# Patient Record
Sex: Male | Born: 1960 | Race: White | Hispanic: No | Smoking: Former smoker
Health system: Southern US, Community
[De-identification: ages and names within clinical notes are randomized; demographics above are authoritative.]

## PROBLEM LIST (undated history)

## (undated) DIAGNOSIS — E782 Mixed hyperlipidemia: Secondary | ICD-10-CM

## (undated) DIAGNOSIS — E7800 Pure hypercholesterolemia, unspecified: Secondary | ICD-10-CM

## (undated) DIAGNOSIS — E119 Type 2 diabetes mellitus without complications: Secondary | ICD-10-CM

## (undated) DIAGNOSIS — I82409 Acute embolism and thrombosis of unspecified deep veins of unspecified lower extremity: Secondary | ICD-10-CM

## (undated) DIAGNOSIS — E1129 Type 2 diabetes mellitus with other diabetic kidney complication: Secondary | ICD-10-CM

## (undated) DIAGNOSIS — R0600 Dyspnea, unspecified: Secondary | ICD-10-CM

## (undated) DIAGNOSIS — M79652 Pain in left thigh: Secondary | ICD-10-CM

## (undated) DIAGNOSIS — Z86718 Personal history of other venous thrombosis and embolism: Secondary | ICD-10-CM

## (undated) DIAGNOSIS — I25118 Atherosclerotic heart disease of native coronary artery with other forms of angina pectoris: Secondary | ICD-10-CM

## (undated) DIAGNOSIS — I2510 Atherosclerotic heart disease of native coronary artery without angina pectoris: Secondary | ICD-10-CM

## (undated) DIAGNOSIS — Z86711 Personal history of pulmonary embolism: Secondary | ICD-10-CM

## (undated) DIAGNOSIS — N200 Calculus of kidney: Secondary | ICD-10-CM

## (undated) HISTORY — PX: SHOULDER ARTHROSCOPY: SHX128

## (undated) HISTORY — PX: HUMERUS FRACTURE SURGERY: SHX670

---
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
## 2006-06-19 HISTORY — PX: ORTHOPEDIC SURGERY: SHX850

## 2015-06-09 DIAGNOSIS — Z794 Long term (current) use of insulin: Secondary | ICD-10-CM

## 2015-06-09 DIAGNOSIS — R809 Proteinuria, unspecified: Secondary | ICD-10-CM

## 2015-06-09 DIAGNOSIS — I25118 Atherosclerotic heart disease of native coronary artery with other forms of angina pectoris: Secondary | ICD-10-CM

## 2015-06-09 DIAGNOSIS — E1129 Type 2 diabetes mellitus with other diabetic kidney complication: Secondary | ICD-10-CM

## 2015-06-09 DIAGNOSIS — E782 Mixed hyperlipidemia: Secondary | ICD-10-CM

## 2015-06-09 DIAGNOSIS — E1140 Type 2 diabetes mellitus with diabetic neuropathy, unspecified: Secondary | ICD-10-CM | POA: Insufficient documentation

## 2015-06-09 HISTORY — DX: Mixed hyperlipidemia: E78.2

## 2015-06-09 HISTORY — DX: Atherosclerotic heart disease of native coronary artery with other forms of angina pectoris: I25.118

## 2015-06-09 HISTORY — DX: Proteinuria, unspecified: R80.9

## 2015-06-09 HISTORY — DX: Type 2 diabetes mellitus with other diabetic kidney complication: Z79.4

## 2015-06-09 HISTORY — DX: Type 2 diabetes mellitus with diabetic neuropathy, unspecified: E11.40

## 2015-06-09 HISTORY — DX: Type 2 diabetes mellitus with other diabetic kidney complication: E11.29

## 2016-02-04 ENCOUNTER — Ambulatory Visit: Payer: Self-pay

## 2016-02-04 ENCOUNTER — Other Ambulatory Visit: Payer: Self-pay | Admitting: Occupational Medicine

## 2016-02-04 DIAGNOSIS — M25472 Effusion, left ankle: Secondary | ICD-10-CM

## 2016-02-04 DIAGNOSIS — M79672 Pain in left foot: Secondary | ICD-10-CM

## 2016-02-04 DIAGNOSIS — M25572 Pain in left ankle and joints of left foot: Principal | ICD-10-CM

## 2016-02-18 ENCOUNTER — Ambulatory Visit (INDEPENDENT_AMBULATORY_CARE_PROVIDER_SITE_OTHER): Payer: Worker's Compensation | Admitting: Orthopaedic Surgery

## 2016-02-18 ENCOUNTER — Encounter (INDEPENDENT_AMBULATORY_CARE_PROVIDER_SITE_OTHER): Payer: Self-pay

## 2016-02-18 DIAGNOSIS — M79672 Pain in left foot: Secondary | ICD-10-CM | POA: Diagnosis not present

## 2016-02-19 ENCOUNTER — Encounter (INDEPENDENT_AMBULATORY_CARE_PROVIDER_SITE_OTHER): Payer: Self-pay | Admitting: Orthopaedic Surgery

## 2016-02-19 NOTE — Progress Notes (Signed)
   Office Visit Note   Patient: Roberto Sosa           Date of Birth: 1960-12-20           MRN: [id number] Visit Date: 02/18/2016              Requested by: No referring provider defined for this encounter. PCP: Dina RichUGH,ROBERT, MD   Assessment & Plan: Visit Diagnoses:  1. Left foot pain     Plan: Based on my assessment of think what he is got is either a plantar fascial rupture or posterior tibial tendon rupture. Recommend MRI of the left foot to fully evaluate this. Follow up after the MRI. Cam Walker for now.  Follow-Up Instructions: Return in about 2 weeks (around 03/03/2016) for review left foot MRI.   Orders:  Orders Placed This Encounter  Procedures  . MR Foot Left w/o contrast   No orders of the defined types were placed in this encounter.     Procedures: No procedures performed   Clinical Data: No additional findings.   Subjective: No chief complaint on file.   The patient is a 55 year old gentleman who had an injury to his left ankle and foot on 02/02/2016 in which he lost his balance and twisted his ankle and subsequently felt and heard a pop on the medial aspect of his ankle. He's been wearing a fracture boot. He is currently doing light duty. There is tingling burning and numbness. Pain does not radiate. Pain is worse with ambulation.    Review of Systems  Constitutional: Negative.   HENT: Negative.   Eyes: Negative.   Respiratory: Negative.   Cardiovascular: Negative.   Gastrointestinal: Negative.   Endocrine: Negative.   Genitourinary: Negative.   Musculoskeletal: Negative.   Skin: Negative.   Allergic/Immunologic: Negative.   Neurological: Negative.   Hematological: Negative.   Psychiatric/Behavioral: Negative.      Objective: Vital Signs: There were no vitals taken for this visit.  Physical Exam  Constitutional: He is oriented to person, place, and time. He appears well-developed and well-nourished.  HENT:  Head: Normocephalic and  atraumatic.  Eyes: EOM are normal.  Neck: Neck supple.  Cardiovascular: Intact distal pulses.   Pulmonary/Chest: Effort normal.  Abdominal: Soft.  Neurological: He is alert and oriented to person, place, and time.  Skin: Skin is warm.  Psychiatric: He has a normal mood and affect. His behavior is normal. Judgment and thought content normal.  Nursing note and vitals reviewed.   Ortho Exam Exam of the left ankle shows moderate swelling. He does have tenderness to palpation at the medial aspect of the navicular. Achilles is nontender. Achilles is intact and has good plantarflexion strength. He has decreased strength with heel raise. He has no focal motor or sensory deficits. He has good ankle dorsiflexion strength. Specialty Comments:  No specialty comments available.  Imaging: No results found.   PMFS History: There are no active problems to display for this patient.  History reviewed. No pertinent past medical history.  History reviewed. No pertinent family history.  History reviewed. No pertinent surgical history. Social History   Occupational History  . Not on file.   Social History Main Topics  . Smoking status: Not on file  . Smokeless tobacco: Not on file  . Alcohol use Not on file  . Drug use: Unknown  . Sexual activity: Not on file

## 2016-02-21 ENCOUNTER — Telehealth (INDEPENDENT_AMBULATORY_CARE_PROVIDER_SITE_OTHER): Payer: Self-pay

## 2016-02-21 NOTE — Telephone Encounter (Signed)
Not really a question.. Dr. Liliane ChannelZu, Friday you had me to stand on the ball of my left foot. Before I got to my car I had a burning sensation in the inner part of my left thigh and behind the leg just above the knee.. I came straight home and got off my feet.. This morning it still hurts but more like a cramp.. Just thought I'd let you know.. And it's harder moving my left foot.. [email or web address]

## 2016-02-21 NOTE — Telephone Encounter (Signed)
Ok thanks.  Make sure he gets his MRI

## 2016-02-25 ENCOUNTER — Telehealth (INDEPENDENT_AMBULATORY_CARE_PROVIDER_SITE_OTHER): Payer: Self-pay

## 2016-02-25 ENCOUNTER — Encounter (INDEPENDENT_AMBULATORY_CARE_PROVIDER_SITE_OTHER): Payer: Self-pay | Admitting: Orthopaedic Surgery

## 2016-02-25 NOTE — Telephone Encounter (Signed)
Need MRI report

## 2016-02-25 NOTE — Telephone Encounter (Signed)
Patient emailed me this.... Please advise.....   Roberto Sosa or Dr. Liliane ChannelZu I brought the disc this afternoon round 2:30 pm.. If it is looked at and you have any info will you please send me a text.Marland Kitchen. Also my thigh is still swole and sore from Dec. 1st when ask to shift my weight to the ball of my foot.. Thanks in advance..Marland Kitchen

## 2016-02-28 ENCOUNTER — Encounter (INDEPENDENT_AMBULATORY_CARE_PROVIDER_SITE_OTHER): Payer: Self-pay | Admitting: Orthopaedic Surgery

## 2016-02-28 ENCOUNTER — Ambulatory Visit (INDEPENDENT_AMBULATORY_CARE_PROVIDER_SITE_OTHER): Payer: Worker's Compensation | Admitting: Orthopaedic Surgery

## 2016-02-28 ENCOUNTER — Telehealth (INDEPENDENT_AMBULATORY_CARE_PROVIDER_SITE_OTHER): Payer: Self-pay | Admitting: Orthopaedic Surgery

## 2016-02-28 DIAGNOSIS — M79672 Pain in left foot: Secondary | ICD-10-CM | POA: Diagnosis not present

## 2016-02-28 NOTE — Progress Notes (Signed)
   Office Visit Note   Patient: Roberto Sosa           Date of Birth: 1960-10-31           MRN: [id number] Visit Date: 02/28/2016              Requested by: Olive Bassobert L Dough, MD [address] [place], [place] [address] PCP: Dina RichUGH,ROBERT, MD   Assessment & Plan: Visit Diagnoses:  1. Left foot pain     Plan: MRI neg for tendon tear.  Mainly contusions and edema.  Expect full recovery in 6-8 weeks.  Return to full duty.  F/u prn  Follow-Up Instructions: Return if symptoms worsen or fail to improve.   Orders:  No orders of the defined types were placed in this encounter.  No orders of the defined types were placed in this encounter.     Procedures: No procedures performed   Clinical Data: No additional findings.   Subjective: Chief Complaint  Patient presents with  . Left Foot - Pain, Follow-up    Following up today for MRI.  Doing better.  Would like to return to full duty.     Review of Systems   Objective: Vital Signs: There were no vitals taken for this visit.  Physical Exam  Ortho Exam Foot is less tender and swelling is improved Specialty Comments:  No specialty comments available.  Imaging: No results found.   PMFS History: There are no active problems to display for this patient.  No past medical history on file.  No family history on file.  No past surgical history on file. Social History   Occupational History  . Not on file.   Social History Main Topics  . Smoking status: Never Smoker  . Smokeless tobacco: Never Used  . Alcohol use Not on file  . Drug use: Unknown  . Sexual activity: Not on file

## 2016-02-28 NOTE — Telephone Encounter (Signed)
Pt work is requesting a return to work note for pt. Her name is Roberto ForwardJo Matthews at [phone], she asked if you can fax this note to [phone]. Attention to: claim number [id number]

## 2016-02-29 NOTE — Telephone Encounter (Signed)
FAXED NOTE

## 2016-03-01 ENCOUNTER — Encounter (INDEPENDENT_AMBULATORY_CARE_PROVIDER_SITE_OTHER): Payer: Self-pay | Admitting: Orthopaedic Surgery

## 2016-03-02 ENCOUNTER — Encounter (INDEPENDENT_AMBULATORY_CARE_PROVIDER_SITE_OTHER): Payer: Self-pay | Admitting: Orthopaedic Surgery

## 2016-03-03 ENCOUNTER — Encounter (INDEPENDENT_AMBULATORY_CARE_PROVIDER_SITE_OTHER): Payer: Self-pay | Admitting: Orthopaedic Surgery

## 2016-03-03 ENCOUNTER — Ambulatory Visit (INDEPENDENT_AMBULATORY_CARE_PROVIDER_SITE_OTHER): Payer: Worker's Compensation | Admitting: Orthopaedic Surgery

## 2016-03-03 DIAGNOSIS — M79652 Pain in left thigh: Secondary | ICD-10-CM | POA: Diagnosis not present

## 2016-03-03 HISTORY — DX: Pain in left thigh: M79.652

## 2016-03-03 MED ORDER — METHOCARBAMOL 500 MG PO TABS: 500.0000 mg | ORAL_TABLET | Freq: Four times a day (QID) | ORAL | 2 refills | Status: DC | PRN

## 2016-03-03 MED ORDER — NAPROXEN 500 MG PO TABS: 500.0000 mg | ORAL_TABLET | Freq: Two times a day (BID) | ORAL | 3 refills | Status: DC

## 2016-03-03 NOTE — Progress Notes (Signed)
   Office Visit Note   Patient: Roberto Sosa           Date of Birth: 1960/04/20           MRN: [id number] Visit Date: 03/03/2016              Requested by: Olive Bassobert L Dough, MD [address] [place], [place] [address] PCP: Dina RichUGH,ROBERT, MD   Assessment & Plan: Visit Diagnoses:  1. Left thigh pain     Plan: Impression is left quadriceps overuse from altered gait. I gave him prescription for muscle relaxer continue with ice and Ace wrap, rest out of work for 2 days. Follow up as needed.  Follow-Up Instructions: Return if symptoms worsen or fail to improve.   Orders:  No orders of the defined types were placed in this encounter.  Meds ordered this encounter  Medications  . methocarbamol (ROBAXIN) 500 MG tablet    Sig: Take 1 tablet (500 mg total) by mouth every 6 (six) hours as needed for muscle spasms.    Dispense:  30 tablet    Refill:  2  . naproxen (NAPROSYN) 500 MG tablet    Sig: Take 1 tablet (500 mg total) by mouth 2 (two) times daily with a meal.    Dispense:  30 tablet    Refill:  3      Procedures: No procedures performed   Clinical Data: No additional findings.   Subjective: Chief Complaint  Patient presents with  . Left Foot - Follow-up, Pain  . Left Thigh - Pain    Patient comes in today with a new plane of left thigh pain. He has been walking with an altered gait which is his thigh out of whack. He states that it burns and cramps. The foot is still slightly stiff. He denies any chest pain shortness of breath or calf pain. He has difficulty sleeping because of the quadriceps pain. He's been taking Tylenol and Aleve.    Review of Systems  Constitutional: Negative.   HENT: Negative.   Eyes: Negative.   Respiratory: Negative.   Cardiovascular: Negative.   Gastrointestinal: Negative.   Endocrine: Negative.   Genitourinary: Negative.   Musculoskeletal: Negative.   Skin: Negative.   Allergic/Immunologic: Negative.   Neurological: Negative.     Hematological: Negative.   Psychiatric/Behavioral: Negative.      Objective: Vital Signs: There were no vitals taken for this visit.  Physical Exam  Well-developed well-nourished no acute distress alert and 3 Ortho Exam Exam of the left quad shows tenderness palpation in the mid forearm region. His knee and hip are both asymptomatic benign exam. Specialty Comments:  No specialty comments available.  Imaging: No results found.   PMFS History: Patient Active Problem List   Diagnosis Date Noted  . Left thigh pain 03/03/2016   No past medical history on file.  No family history on file.  No past surgical history on file. Social History   Occupational History  . Not on file.   Social History Main Topics  . Smoking status: Never Smoker  . Smokeless tobacco: Never Used  . Alcohol use Not on file  . Drug use: Unknown  . Sexual activity: Not on file

## 2016-03-06 ENCOUNTER — Encounter (INDEPENDENT_AMBULATORY_CARE_PROVIDER_SITE_OTHER): Payer: Self-pay | Admitting: Orthopaedic Surgery

## 2016-03-06 ENCOUNTER — Emergency Department (HOSPITAL_COMMUNITY)
Admission: EM | Admit: 2016-03-06 | Discharge: 2016-03-06 | Disposition: A | Discharge status: Home / Self Care | Payer: Worker's Compensation | Source: Home / Self Care | Attending: Emergency Medicine | Admitting: Emergency Medicine

## 2016-03-06 ENCOUNTER — Encounter (HOSPITAL_COMMUNITY): Payer: Self-pay

## 2016-03-06 DIAGNOSIS — M79605 Pain in left leg: Secondary | ICD-10-CM | POA: Diagnosis not present

## 2016-03-06 DIAGNOSIS — I82402 Acute embolism and thrombosis of unspecified deep veins of left lower extremity: Secondary | ICD-10-CM | POA: Insufficient documentation

## 2016-03-06 DIAGNOSIS — I82422 Acute embolism and thrombosis of left iliac vein: Secondary | ICD-10-CM | POA: Diagnosis not present

## 2016-03-06 DIAGNOSIS — Z79899 Other long term (current) drug therapy: Secondary | ICD-10-CM

## 2016-03-06 DIAGNOSIS — Z7982 Long term (current) use of aspirin: Secondary | ICD-10-CM | POA: Insufficient documentation

## 2016-03-06 DIAGNOSIS — Z7984 Long term (current) use of oral hypoglycemic drugs: Secondary | ICD-10-CM

## 2016-03-06 DIAGNOSIS — I2510 Atherosclerotic heart disease of native coronary artery without angina pectoris: Secondary | ICD-10-CM | POA: Insufficient documentation

## 2016-03-06 DIAGNOSIS — Z7901 Long term (current) use of anticoagulants: Secondary | ICD-10-CM | POA: Insufficient documentation

## 2016-03-06 DIAGNOSIS — E119 Type 2 diabetes mellitus without complications: Secondary | ICD-10-CM | POA: Insufficient documentation

## 2016-03-06 HISTORY — DX: Pure hypercholesterolemia, unspecified: E78.00

## 2016-03-06 HISTORY — DX: Type 2 diabetes mellitus without complications: E11.9

## 2016-03-06 HISTORY — DX: Atherosclerotic heart disease of native coronary artery without angina pectoris: I25.10

## 2016-03-06 LAB — I-STAT CG4 LACTIC ACID, ED
Lactic Acid, Venous: 2.8 mmol/L (ref 0.5–1.9)
Lactic Acid, Venous: 2.91 mmol/L (ref 0.5–1.9)

## 2016-03-06 LAB — BASIC METABOLIC PANEL
Anion gap: 12 (ref 5–15)
CHLORIDE: 103 mmol/L (ref 101–111)
CO2: 22 mmol/L (ref 22–32)
Calcium: 9.3 mg/dL (ref 8.9–10.3)
Creatinine, Ser: 0.62 mg/dL (ref 0.61–1.24)
GFR calc Af Amer: >60 mL/min (ref >60)
GFR calc non Af Amer: >60 mL/min (ref >60)
GLUCOSE: 318 mg/dL — AB (ref 65–99)
POTASSIUM: 4 mmol/L (ref 3.5–5.1)
Sodium: 137 mmol/L (ref 135–145)

## 2016-03-06 LAB — CBC WITH DIFFERENTIAL/PLATELET
Basophils Absolute: 0 10*3/uL (ref 0.0–0.1)
Basophils Relative: 0 %
EOS PCT: 3 %
Eosinophils Absolute: 0.3 10*3/uL (ref 0.0–0.7)
HCT: 42.9 % (ref 39.0–52.0)
HEMOGLOBIN: 15.1 g/dL (ref 13.0–17.0)
LYMPHS ABS: 2.7 10*3/uL (ref 0.7–4.0)
LYMPHS PCT: 25 %
MCH: 29 pg (ref 26.0–34.0)
MCHC: 35.2 g/dL (ref 30.0–36.0)
MCV: 82.3 fL (ref 78.0–100.0)
Monocytes Absolute: 0.5 10*3/uL (ref 0.1–1.0)
Monocytes Relative: 5 %
Neutro Abs: 7.2 10*3/uL (ref 1.7–7.7)
Neutrophils Relative %: 67 %
PLATELETS: 201 10*3/uL (ref 150–400)
RBC: 5.21 MIL/uL (ref 4.22–5.81)
RDW: 13 % (ref 11.5–15.5)
WBC: 10.8 10*3/uL — AB (ref 4.0–10.5)

## 2016-03-06 MED ORDER — RIVAROXABAN (XARELTO) EDUCATION KIT FOR DVT/PE PATIENTS
PACK | Freq: Once | Status: AC
  Administered 2016-03-06: 23:00:00
  Filled 2016-03-06: qty 1

## 2016-03-06 MED ORDER — RIVAROXABAN 15 MG PO TABS
15.0000 mg | ORAL_TABLET | Freq: Once | ORAL | Status: AC
  Administered 2016-03-06: 15 mg via ORAL
  Filled 2016-03-06: qty 1

## 2016-03-06 MED ORDER — RIVAROXABAN (XARELTO) VTE STARTER PACK (15 & 20 MG): ORAL_TABLET | ORAL | 0 refills | Status: DC

## 2016-03-06 NOTE — Telephone Encounter (Signed)
Patient emailed us today. See message below.  The swelling in the left calf and thigh hasn't gone away. I've been off my leg totally since Friday except to use the bathroom..Marland Kitchen

## 2016-03-06 NOTE — Discharge Instructions (Signed)
Your blood work has been normal, your ultrasound shows that you have a blood clot in your left leg. Please come back in the morning for a formal ultrasound, go to outpatient imaging and the radiology department at this hospital. If you should develop any chest pain or difficulty breathing or if your leg begins to swell even more or becomes more painful, please return to the emergency department immediately for repeat evaluation as this could be a life-threatening complication of a blood clot.  Please call your doctor in the morning to inform them of this new diagnosis of a blood clot. He will need to be seen within the next week for follow-up and for continual management of this problem. He will likely continue to have swelling of her leg for the next couple of months but it should gradually improve. Again if things get any worse please come back immediately.  Additionally, if you should develop any abnormal bleeding including nosebleeds, blood in your stool, blood in your urine, vomiting up blood or severe or worsening headache or difficulty breathing, come back to the emergency department immediately.

## 2016-03-06 NOTE — ED Provider Notes (Signed)
[facility]-EMERGENCY DEPT Provider Note   CSN: [id number] Arrival date & time: 03/06/16  1722     History   Chief Complaint Chief Complaint  Patient presents with  . left leg pain/swelling    HPI Roberto Sosa is a 55 y.o. male.  HPI  The patient is a 55-year-old male, known history of coronary disease, diabetes and high cholesterol, presents to the hospital today with a swollen left leg. He reports that approximately one month ago he fell while at work, he bent his leg the wrong way, he felt acute onset of pain and was was able to walk on the leg, he followed up with orthopedics in approximately 18 days ago on December 1 while he was being examined he felt more pain in the leg and since that time has had progressive swelling of the left lower extremity from the mid calf all the way up to the groin. This has been persistent, gradually worsening, it was recommended today by his orthopedist that he come to the hospital for an ultrasound of his leg. The patient denies any chest pain or shortness of breath whatsoever. He has never had a blood clot, no other predisposing risk factors for blood clots.  Past Medical History:  Diagnosis Date  . Coronary artery disease   . Diabetes mellitus without complication (HCC)   . High cholesterol     Patient Active Problem List   Diagnosis Date Noted  . Left thigh pain 03/03/2016    History reviewed. No pertinent surgical history.     Home Medications    Prior to Admission medications   Medication Sig Start Date End Date Taking? Authorizing Provider  aspirin EC 81 MG tablet Take by mouth.    Historical Provider, MD  atorvastatin (LIPITOR) 80 MG tablet Take 80 mg by mouth. 03/02/16   Historical Provider, MD  carvedilol (COREG) 12.5 MG tablet Take 12.5 mg by mouth. 03/01/16   Historical Provider, MD  gabapentin (NEURONTIN) 600 MG tablet Take 600 mg by mouth. 03/01/16   Historical Provider, MD  metFORMIN (GLUCOPHAGE) 1000 MG tablet Take 1,000 mg  by mouth. 03/02/16   Historical Provider, MD  methocarbamol (ROBAXIN) 500 MG tablet Take 1 tablet (500 mg total) by mouth every 6 (six) hours as needed for muscle spasms. 03/03/16   Naiping M Xu, MD  naproxen (NAPROSYN) 500 MG tablet Take 1 tablet (500 mg total) by mouth 2 (two) times daily with a meal. 03/03/16   Naiping M Xu, MD  nitroGLYCERIN (NITROSTAT) 0.4 MG SL tablet Place 0.4 mg under the tongue.    Historical Provider, MD  Rivaroxaban 15 & 20 MG TBPK Take as directed on package: Start with one 15mg tablet by mouth twice a day with food. On Day 22, switch to one 20mg tablet once a day with food. 03/06/16   Brian Miller, MD    Family History No family history on file.  Social History Social History  Substance Use Topics  . Smoking status: Never Smoker  . Smokeless tobacco: Never Used  . Alcohol use Not on file     Allergies   Patient has no known allergies.   Review of Systems Review of Systems  All other systems reviewed and are negative.    Physical Exam Updated Vital Signs BP 143/73 (BP Location: Left Arm)   Pulse 80   Temp 97.8 F (36.6 C) (Oral)   Resp (!) 27   SpO2 98%   Physical Exam  Constitutional: He appears well-developed and   well-nourished. No distress.  HENT:  Head: Normocephalic and atraumatic.  Mouth/Throat: Oropharynx is clear and moist. No oropharyngeal exudate.  Eyes: Conjunctivae and EOM are normal. Pupils are equal, round, and reactive to light. Right eye exhibits no discharge. Left eye exhibits no discharge. No scleral icterus.  Neck: Normal range of motion. Neck supple. No JVD present. No thyromegaly present.  Cardiovascular: Normal rate, regular rhythm, normal heart sounds and intact distal pulses.  Exam reveals no gallop and no friction rub.   No murmur heard. Pulmonary/Chest: Effort normal and breath sounds normal. No respiratory distress. He has no wheezes. He has no rales.  Abdominal: Soft. Bowel sounds are normal. He exhibits no  distension and no mass. There is no tenderness.  Musculoskeletal: Normal range of motion. He exhibits edema. He exhibits no tenderness.  The patient has significant edema of the left lower extremity, there are normal pulses to the left foot, normal capillary refill but he does have 2+ pitting edema from the knee through the ankle and a slight fullness of the thigh. The right lower extremity is normal, bilateral upper extremities are normal.  Lymphadenopathy:    He has no cervical adenopathy.  Neurological: He is alert. Coordination normal.  Skin: Skin is warm and dry. No rash noted. No erythema.  Psychiatric: He has a normal mood and affect. His behavior is normal.  Nursing note and vitals reviewed.    ED Treatments / Results  Labs (all labs ordered are listed, but only abnormal results are displayed) Labs Reviewed  CBC WITH DIFFERENTIAL/PLATELET - Abnormal; Notable for the following:       Result Value   WBC 10.8 (*)    All other components within normal limits  BASIC METABOLIC PANEL - Abnormal; Notable for the following:    Glucose, Bld 318 (*)    BUN <5 (*)    All other components within normal limits  I-STAT CG4 LACTIC ACID, ED - Abnormal; Notable for the following:    Lactic Acid, Venous 2.80 (*)    All other components within normal limits  I-STAT CG4 LACTIC ACID, ED    Radiology No results found.  Procedures Procedures (including critical care time)  Medications Ordered in ED Medications  rivaroxaban (XARELTO) Education Kit for DVT/PE patients (not administered)  Rivaroxaban (XARELTO) tablet 15 mg (15 mg Oral Given 03/06/16 2218)     Initial Impression / Assessment and Plan / ED Course  I have reviewed the triage vital signs and the nursing notes.  Pertinent labs & imaging results that were available during my care of the patient were reviewed by me and considered in my medical decision making (see chart for details).  Clinical Course    The patient has clear  evidence of a DVT on my bedside ultrasound, this fits with his clinical presentation, he does not have any signs of pulmonary embolism including no tachycardia, hypotension, hypoxia or any chest pain or shortness of breath. His hemoglobin is normal, his kidney function is normal, she will qualify for Xarelto treatment and after a long discussion with the patient about the risks benefits and alternatives of treatment with anticoagulants he has agreed to Xarelto treatment going forward instead of Coumadin. He will call his doctor in the morning to let them know about the findings as well as follow up for the formal ultrasound which I will order. I have given him all of the indications for return including chest pain shortness of breath or any significant abnormal feelings in his   chest or leg worsening swelling. He has accepted these terms and is in agreement with discharge. Xarelto given prior to discharge.  Final Clinical Impressions(s) / ED Diagnoses   Final diagnoses:  Acute deep vein thrombosis (DVT) of left lower extremity, unspecified vein (HCC)    New Prescriptions New Prescriptions   RIVAROXABAN 15 & 20 MG TBPK    Take as directed on package: Start with one 32m tablet by mouth twice a day with food. On Day 22, switch to one 258mtablet once a day with food.     BrNoemi ChapelMD 03/06/16 2221

## 2016-03-06 NOTE — ED Triage Notes (Addendum)
Patient here with left leg swelling from hip to foot since 12/1. States he experienced fall in November and thinks related. Positive distal pulses, no redness from ankle to foot. denies pain but complains of tightness. No shortness of breath, no CP

## 2016-03-06 NOTE — ED Notes (Signed)
ED Provider at bedside. 

## 2016-03-06 NOTE — ED Notes (Signed)
Pt departed in NAD.  

## 2016-03-07 ENCOUNTER — Inpatient Hospital Stay (HOSPITAL_COMMUNITY)
Admission: AD | Admit: 2016-03-07 | Discharge: 2016-03-11 | DRG: 272 | Disposition: A | Discharge status: Home / Self Care | Payer: Worker's Compensation | Source: Ambulatory Visit | Attending: Vascular Surgery | Admitting: Vascular Surgery

## 2016-03-07 ENCOUNTER — Encounter (HOSPITAL_COMMUNITY): Payer: Self-pay

## 2016-03-07 DIAGNOSIS — I82422 Acute embolism and thrombosis of left iliac vein: Principal | ICD-10-CM | POA: Diagnosis present

## 2016-03-07 DIAGNOSIS — I824Y2 Acute embolism and thrombosis of unspecified deep veins of left proximal lower extremity: Secondary | ICD-10-CM | POA: Diagnosis not present

## 2016-03-07 DIAGNOSIS — Z7901 Long term (current) use of anticoagulants: Secondary | ICD-10-CM | POA: Diagnosis not present

## 2016-03-07 DIAGNOSIS — Z7984 Long term (current) use of oral hypoglycemic drugs: Secondary | ICD-10-CM

## 2016-03-07 DIAGNOSIS — M7989 Other specified soft tissue disorders: Secondary | ICD-10-CM | POA: Diagnosis not present

## 2016-03-07 DIAGNOSIS — E1151 Type 2 diabetes mellitus with diabetic peripheral angiopathy without gangrene: Secondary | ICD-10-CM | POA: Diagnosis present

## 2016-03-07 DIAGNOSIS — E7800 Pure hypercholesterolemia, unspecified: Secondary | ICD-10-CM | POA: Diagnosis present

## 2016-03-07 DIAGNOSIS — I82409 Acute embolism and thrombosis of unspecified deep veins of unspecified lower extremity: Secondary | ICD-10-CM | POA: Diagnosis present

## 2016-03-07 DIAGNOSIS — M79605 Pain in left leg: Secondary | ICD-10-CM | POA: Diagnosis present

## 2016-03-07 DIAGNOSIS — Z86718 Personal history of other venous thrombosis and embolism: Secondary | ICD-10-CM

## 2016-03-07 DIAGNOSIS — Z7982 Long term (current) use of aspirin: Secondary | ICD-10-CM

## 2016-03-07 DIAGNOSIS — I82412 Acute embolism and thrombosis of left femoral vein: Secondary | ICD-10-CM | POA: Diagnosis present

## 2016-03-07 DIAGNOSIS — Z79899 Other long term (current) drug therapy: Secondary | ICD-10-CM

## 2016-03-07 DIAGNOSIS — M79609 Pain in unspecified limb: Secondary | ICD-10-CM

## 2016-03-07 DIAGNOSIS — I82402 Acute embolism and thrombosis of unspecified deep veins of left lower extremity: Secondary | ICD-10-CM

## 2016-03-07 DIAGNOSIS — I2510 Atherosclerotic heart disease of native coronary artery without angina pectoris: Secondary | ICD-10-CM | POA: Diagnosis present

## 2016-03-07 HISTORY — DX: Acute embolism and thrombosis of unspecified deep veins of unspecified lower extremity: I82.409

## 2016-03-07 HISTORY — DX: Personal history of other venous thrombosis and embolism: Z86.718

## 2016-03-07 LAB — GLUCOSE, CAPILLARY
Glucose-Capillary: 248 mg/dL — ABNORMAL HIGH (ref 65–99)
Glucose-Capillary: 259 mg/dL — ABNORMAL HIGH (ref 65–99)

## 2016-03-07 LAB — SURGICAL PCR SCREEN
MRSA, PCR: NEGATIVE
Staphylococcus aureus: POSITIVE — AB

## 2016-03-07 MED ORDER — PANTOPRAZOLE SODIUM 40 MG PO TBEC: 40.0000 mg | DELAYED_RELEASE_TABLET | Freq: Every day | ORAL | Status: DC

## 2016-03-07 MED ORDER — GUAIFENESIN-DM 100-10 MG/5ML PO SYRP
15.0000 mL | ORAL_SOLUTION | ORAL | Status: DC | PRN
Start: 2016-03-07 — End: 2016-03-07

## 2016-03-07 MED ORDER — POTASSIUM CHLORIDE CRYS ER 20 MEQ PO TBCR: 20.0000 meq | EXTENDED_RELEASE_TABLET | Freq: Once | ORAL | Status: DC

## 2016-03-07 MED ORDER — HYDRALAZINE HCL 20 MG/ML IJ SOLN: 5.0000 mg | INTRAMUSCULAR | Status: DC | PRN

## 2016-03-07 MED ORDER — SODIUM CHLORIDE 0.9 % IV SOLN: INTRAVENOUS | Status: DC

## 2016-03-07 MED ORDER — ACETAMINOPHEN 325 MG RE SUPP: 325.0000 mg | RECTAL | Status: DC | PRN

## 2016-03-07 MED ORDER — METHOCARBAMOL 500 MG PO TABS
500.0000 mg | ORAL_TABLET | Freq: Four times a day (QID) | ORAL | Status: DC | PRN
  Administered 2016-03-08 – 2016-03-09 (×2): 500 mg via ORAL
  Filled 2016-03-07 (×2): qty 1

## 2016-03-07 MED ORDER — ACETAMINOPHEN 325 MG PO TABS: 325.0000 mg | ORAL_TABLET | ORAL | Status: DC | PRN

## 2016-03-07 MED ORDER — PHENOL 1.4 % MT LIQD: 1.0000 | OROMUCOSAL | Status: DC | PRN

## 2016-03-07 MED ORDER — METOPROLOL TARTRATE 5 MG/5ML IV SOLN: 2.0000 mg | INTRAVENOUS | Status: DC | PRN

## 2016-03-07 MED ORDER — METFORMIN HCL 500 MG PO TABS
1000.0000 mg | ORAL_TABLET | Freq: Every day | ORAL | Status: DC
  Administered 2016-03-08: 1000 mg via ORAL
  Filled 2016-03-07 (×2): qty 2

## 2016-03-07 MED ORDER — ONDANSETRON HCL 4 MG/2ML IJ SOLN: 4.0000 mg | Freq: Four times a day (QID) | INTRAMUSCULAR | Status: DC | PRN

## 2016-03-07 MED ORDER — LABETALOL HCL 5 MG/ML IV SOLN: 10.0000 mg | INTRAVENOUS | Status: DC | PRN

## 2016-03-07 MED ORDER — OXYCODONE-ACETAMINOPHEN 5-325 MG PO TABS: 1.0000 | ORAL_TABLET | ORAL | Status: DC | PRN

## 2016-03-07 MED ORDER — HEPARIN BOLUS VIA INFUSION
6000.0000 [IU] | Freq: Once | INTRAVENOUS | Status: AC
  Administered 2016-03-07: 6000 [IU] via INTRAVENOUS
  Filled 2016-03-07: qty 6000

## 2016-03-07 MED ORDER — CARVEDILOL 12.5 MG PO TABS
12.5000 mg | ORAL_TABLET | Freq: Every day | ORAL | Status: DC
  Administered 2016-03-07 – 2016-03-11 (×5): 12.5 mg via ORAL
  Filled 2016-03-07 (×5): qty 1

## 2016-03-07 MED ORDER — SENNOSIDES-DOCUSATE SODIUM 8.6-50 MG PO TABS: 1.0000 | ORAL_TABLET | Freq: Every evening | ORAL | Status: DC | PRN

## 2016-03-07 MED ORDER — INSULIN ASPART 100 UNIT/ML ~~LOC~~ SOLN
0.0000 [IU] | Freq: Three times a day (TID) | SUBCUTANEOUS | Status: DC
  Administered 2016-03-07: 3 [IU] via SUBCUTANEOUS

## 2016-03-07 MED ORDER — GABAPENTIN 600 MG PO TABS
600.0000 mg | ORAL_TABLET | Freq: Every day | ORAL | Status: DC
  Administered 2016-03-07 – 2016-03-10 (×4): 600 mg via ORAL
  Filled 2016-03-07 (×4): qty 1

## 2016-03-07 MED ORDER — HEPARIN (PORCINE) IN NACL 100-0.45 UNIT/ML-% IJ SOLN
1800.0000 [IU]/h | INTRAMUSCULAR | Status: DC
  Administered 2016-03-07 – 2016-03-08 (×3): 1800 [IU]/h via INTRAVENOUS
  Filled 2016-03-07 (×4): qty 250

## 2016-03-07 MED ORDER — ALUM & MAG HYDROXIDE-SIMETH 200-200-20 MG/5ML PO SUSP: 15.0000 mL | ORAL | Status: DC | PRN

## 2016-03-07 MED ORDER — DOCUSATE SODIUM 100 MG PO CAPS: 100.0000 mg | ORAL_CAPSULE | Freq: Two times a day (BID) | ORAL | Status: DC

## 2016-03-07 MED ORDER — ASPIRIN EC 81 MG PO TBEC
81.0000 mg | DELAYED_RELEASE_TABLET | Freq: Every day | ORAL | Status: DC
  Administered 2016-03-07 – 2016-03-10 (×4): 81 mg via ORAL
  Filled 2016-03-07 (×4): qty 1

## 2016-03-07 NOTE — H&P (Signed)
Referring Physician: Eber HongBrian Miller MD  Patient name: Roberto Sosa MRN: [id number] DOB: 10-13-1960 Sex: male  REASON FOR CONSULT: left leg DVT  HPI: Roberto Sosa is a 55 y.o. male with history of fall at work on left leg 11/17.  He saw Dr Fayrene FearingXiu from orthopedics on 12/1 ankle and foot xray essentially negative.  He had an MRI of foot as well but this is not available today.  He developed swelling in left leg after that appt.  He was seen in the ER last pm and got a xarelto starter pack last night.  At his US today in vasc lab he had extensive DVT entire left leg.  Leg is painful and heavy but motor sensation intact.  He denies hemoptysis or dyspnea.  Chronic medical problems include CAD, DM, elevated cholesterol all of which are stable.  He denies family history of DVT but is adopted.  He has no prior history of head bleed or there bleeding complications.  Past Medical History:  Diagnosis Date  . Coronary artery disease   . Diabetes mellitus without complication (HCC)   . High cholesterol    No past surgical history on file.  No family history on file.  SOCIAL HISTORY: Social History   Social History  . Marital status: Divorced    Spouse name: N/A  . Number of children: N/A  . Years of education: N/A   Occupational History  . Not on file.   Social History Main Topics  . Smoking status: Never Smoker  . Smokeless tobacco: Never Used  . Alcohol use Not on file  . Drug use: Unknown  . Sexual activity: Not on file   Other Topics Concern  . Not on file   Social History Narrative  . No narrative on file    No Known Allergies  Current Outpatient Prescriptions  Medication Sig Dispense Refill  . aspirin EC 81 MG tablet Take by mouth.    Marland Kitchen. atorvastatin (LIPITOR) 80 MG tablet Take 80 mg by mouth.    . carvedilol (COREG) 12.5 MG tablet Take 12.5 mg by mouth.    . gabapentin (NEURONTIN) 600 MG tablet Take 600 mg by mouth.    . metFORMIN (GLUCOPHAGE) 1000 MG tablet Take 1,000 mg by  mouth.    . methocarbamol (ROBAXIN) 500 MG tablet Take 1 tablet (500 mg total) by mouth every 6 (six) hours as needed for muscle spasms. 30 tablet 2  . naproxen (NAPROSYN) 500 MG tablet Take 1 tablet (500 mg total) by mouth 2 (two) times daily with a meal. 30 tablet 3  . nitroGLYCERIN (NITROSTAT) 0.4 MG SL tablet Place 0.4 mg under the tongue.    . Rivaroxaban 15 & 20 MG TBPK Take as directed on package: Start with one 15mg  tablet by mouth twice a day with food. On Day 22, switch to one 20mg  tablet once a day with food. 51 each 0   No current facility-administered medications for this encounter.     ROS:   General:  No weight loss, Fever, chills  HEENT: No recent headaches, no nasal bleeding, no visual changes, no sore throat  Neurologic: No dizziness, blackouts, seizures. No recent symptoms of stroke or mini- stroke. No recent episodes of slurred speech, or temporary blindness.  Cardiac: No recent episodes of chest pain/pressure, no shortness of breath at rest.  No shortness of breath with exertion.  Denies history of atrial fibrillation or irregular heartbeat  Vascular: No history of rest pain in feet.  No history of claudication.  No history of non-healing ulcer, No history of DVT   Pulmonary: No home oxygen, no productive cough, no hemoptysis,  No asthma or wheezing  Musculoskeletal:  [ ]  Arthritis, [ ]  Low back pain,  [ ]  Joint pain  Hematologic:No history of hypercoagulable state.  No history of easy bleeding.  No history of anemia  Gastrointestinal: No hematochezia or melena,  No gastroesophageal reflux, no trouble swallowing  Urinary: [ ]  chronic Kidney disease, [ ]  on HD - [ ]  MWF or [ ]  TTHS, [ ]  Burning with urination, [ ]  Frequent urination, [ ]  Difficulty urinating;   Skin: No rashes  Psychological: No history of anxiety,  No history of depression   Physical Examination  There were no vitals filed for this visit.  There is no height or weight on file to calculate  BMI.  General:  Alert and oriented, no acute distress HEENT: Normal Neck: No bruit or JVD Pulmonary: Clear to auscultation bilaterally Cardiac: Regular Rate and Rhythm  Abdomen: Soft, non-tender, non-distended, no mass, no scars Skin: No rash Extremity Pulses:  2+ radial, brachial, femoral, right 2+dorsalis pedis, posterior tibial pulses absent on left but probably secondary to edema foot is pink and warm Musculoskeletal: No deformity extensive 3+ pitting edema entire left leg extending from thigh to foot Neurologic: Upper and lower extremity motor 5/5 and symmetric  DATA:  Left leg DVT femoral down to distal leg  ASSESSMENT:  Extensive left leg DVT would benefit from thrombolysis to prevent post phlebitic syndrome   PLAN:  1. NPO p midnight  2. IV heparin  3. Consent for thrombolysis   Fabienne Brunsharles Fields, MD [facility] [place] Office: [phone] Pager: [phone]

## 2016-03-07 NOTE — Progress Notes (Signed)
VASCULAR LAB PRELIMINARY  PRELIMINARY  PRELIMINARY  PRELIMINARY  Bilateral lower extremity venous duplex completed.    Preliminary report:  Left - positive for DVT in the posterior tibial, popliteal, femoral and common femoral veins. Superficial thrombosis of the porximal greater saphenous vein No evidence of a Baker's cyst. Right - Right:  No evidence of DVT, superficial thrombosis, or Baker's cyst.  Rainn Zupko, RVS 03/07/2016, 5:35 PM

## 2016-03-07 NOTE — Progress Notes (Signed)
Pt has arrived to 2w as direct admit. Telemetry box applied and CCMD notified. Pt oriented to room. Vitals are stable. Assessment completed. Admission assessment completed. IV inserted. Pharmacy tech notified of pt's arrival. Pt has no complaints at this time. Will follow current plan of care.   Berdine DanceLauren Moffitt BSN, RN

## 2016-03-07 NOTE — Progress Notes (Signed)
ANTICOAGULATION CONSULT NOTE - Initial Consult  Pharmacy Consult for heparin Indication: DVT  No Known Allergies  Patient Measurements: TBW: 122.8 kg IBW: 80 kg Heparin Dosing Weight: 107 kg  Assessment: 55 yo M presents on 12/18 with new left leg DVT. Pharmacy consulted for heparin.  CBC stable, no s/s of bleed. No anticoag PTA. Plan is for vascular surgery to do thrombolysis tomorrow.  Goal of Therapy:  Heparin level 0.3-0.7 units/ml Monitor platelets by anticoagulation protocol: Yes   Plan:  Give heparin 6,000 unit bolus Start heparin gtt at 1,800 units/hr Check 6 hr heparin level Monitor daily heparin level, CBC, s/s of bleed  Enzo BiNathan Layani Foronda, PharmD, BCPS Clinical Pharmacist Pager [phone] 03/07/2016 5:03 PM

## 2016-03-08 ENCOUNTER — Ambulatory Visit (HOSPITAL_COMMUNITY): Admission: RE | Admit: 2016-03-08 | Payer: Self-pay | Source: Ambulatory Visit | Admitting: Vascular Surgery

## 2016-03-08 LAB — CBC
HCT: 38.4 % — ABNORMAL LOW (ref 39.0–52.0)
HEMOGLOBIN: 13.4 g/dL (ref 13.0–17.0)
MCH: 28.5 pg (ref 26.0–34.0)
MCHC: 34.9 g/dL (ref 30.0–36.0)
MCV: 81.7 fL (ref 78.0–100.0)
Platelets: 199 10*3/uL (ref 150–400)
RBC: 4.7 MIL/uL (ref 4.22–5.81)
RDW: 13 % (ref 11.5–15.5)
WBC: 10.2 10*3/uL (ref 4.0–10.5)

## 2016-03-08 LAB — GLUCOSE, CAPILLARY
GLUCOSE-CAPILLARY: 237 mg/dL — AB (ref 65–99)
Glucose-Capillary: 426 mg/dL — ABNORMAL HIGH (ref 65–99)
Glucose-Capillary: 240 mg/dL — ABNORMAL HIGH (ref 65–99)

## 2016-03-08 LAB — APTT: APTT: 71 s — AB (ref 24–36)

## 2016-03-08 LAB — HEPARIN LEVEL (UNFRACTIONATED)
HEPARIN UNFRACTIONATED: 0.54 [IU]/mL (ref 0.30–0.70)
Heparin Unfractionated: 0.57 IU/mL (ref 0.30–0.70)

## 2016-03-08 MED ORDER — INSULIN ASPART 100 UNIT/ML ~~LOC~~ SOLN
0.0000 [IU] | Freq: Three times a day (TID) | SUBCUTANEOUS | Status: DC
Start: 2016-03-08 — End: 2016-03-11
  Administered 2016-03-08: 20 [IU] via SUBCUTANEOUS
  Administered 2016-03-08 – 2016-03-09 (×2): 7 [IU] via SUBCUTANEOUS
  Administered 2016-03-09: 11 [IU] via SUBCUTANEOUS
  Administered 2016-03-10 (×2): 7 [IU] via SUBCUTANEOUS
  Administered 2016-03-11: 11 [IU] via SUBCUTANEOUS

## 2016-03-08 MED ORDER — SODIUM CHLORIDE 0.9 % IV SOLN
INTRAVENOUS | Status: DC
  Administered 2016-03-08 – 2016-03-09 (×3): via INTRAVENOUS

## 2016-03-08 NOTE — Progress Notes (Signed)
ANTICOAGULATION CONSULT NOTE - Initial Consult  Pharmacy Consult for heparin Indication: DVT  No Known Allergies  Patient Measurements: TBW: 122.8 kg IBW: 80 kg Heparin Dosing Weight: 107 kg  Assessment: 55 yo M presents on 12/18 with new left leg DVT. Pharmacy consulted for heparin. Heparin level (0.57) remains therapeutic on 1800 units/hr. CBC stable. No bleeding noted per chart. Thrombolysis moved to Thursday 12/21    Goal of Therapy:  Heparin level 0.3-0.7 units/ml Monitor platelets by anticoagulation protocol: Yes   Plan:  Continue IV heparin 1,800 units/hr Monitor daily heparin level, CBC, s/s of bleed F/u plans for oral anticoagulation after thrombolysis.  Bayard HuggerMei Lynise Porr, PharmD, BCPS  Clinical Pharmacist  Pager: [phone]   03/08/2016 9:48 AM

## 2016-03-08 NOTE — Progress Notes (Signed)
Spoke to [name] with Diabetes Management Team to inform or order. We discussed pt's CBG and interventions today- my number provided and she reports that someone will follow up with me

## 2016-03-08 NOTE — Progress Notes (Signed)
[facility]  Subjective  - leg feels a little better   Objective 125/69 64 97.9 F (36.6 C) (Oral) 18 99%  Intake/Output Summary (Last 24 hours) at 03/08/16 0755 Last data filed at 03/08/16 0500  Gross per 24 hour  Intake            419.7 ml  Output                0 ml  Net            419.7 ml   Left leg edema essentially the same as yesterday evening 3 + edema  Assessment/Planning: Risks benefits possible complications of thrombolysis discussed with pt again.  These include but are not limited to bleeding infection contrast reaction.  Risk of low risk of intracranial bleeding and renal failure discussed.   He wishes to proceed with thrombolyis.  Dr Imogene Burnhen to start lysis catheter or combined mechanical lysis +/- stent today    Keep NPO  Keep heparin going Pt will need insulin sliding scale for now and will need to hold metformin post procedure Hyperglycemia currently poorly controlled  Roberto Sosa, Roberto Sosa 03/08/2016 7:55 AM --  Laboratory Lab Results:  Recent Labs  03/06/16 1742 03/08/16 0026  WBC 10.8* 10.2  HGB 15.1 13.4  HCT 42.9 38.4*  PLT 201 199   BMET  Recent Labs  03/06/16 1742  NA 137  K 4.0  CL 103  CO2 22  GLUCOSE 318*  BUN <5*  CREATININE 0.62  CALCIUM 9.3    COAG No results found for: INR, PROTIME No results found for: PTT

## 2016-03-08 NOTE — Progress Notes (Signed)
ANTICOAGULATION CONSULT NOTE - Follow Up Consult  Pharmacy Consult for heparin Indication: DVT  Labs:  Recent Labs  03/06/16 1742 03/08/16 0026  HGB 15.1 13.4  HCT 42.9 38.4*  PLT 201 199  HEPARINUNFRC  --  0.54  CREATININE 0.62  --     Assessment/Plan:  55yo male therapeutic on heparin with initial dosing for DVT. Will continue gtt at current rate and confirm stable with additional level.   Vernard GamblesVeronda Sara Selvidge, PharmD, BCPS  03/08/2016,1:36 AM

## 2016-03-08 NOTE — Progress Notes (Signed)
Results for Roberto Sosa, [name] (MRN [id number]) as of 03/08/2016 12:42  Ref. Range 03/07/2016 17:56 03/07/2016 21:07 03/08/2016 11:23  Glucose-Capillary Latest Ref Range: 65 - 99 mg/dL 829248 (H) 562259 (H) 130426 (H)   Received diabetes coordinator consult. Recommend starting Lantus 25 units daily (122 kg X 0.2 units/kg) for blood sugar control. Smith MinceKendra Bellamia Ferch RN BSN CDE

## 2016-03-08 NOTE — Progress Notes (Signed)
Thrombolysis moved to Thursday 12/21 due to schedule issues.  Pt may eat today Continue heparin NPO p midnight Consent  Fabienne Brunsharles Fields, MD [facility] [place] Office: [phone] Pager: [phone]

## 2016-03-09 ENCOUNTER — Encounter (HOSPITAL_COMMUNITY)
Admission: AD | Disposition: A | Discharge status: Home / Self Care | Payer: Self-pay | Source: Ambulatory Visit | Attending: Vascular Surgery

## 2016-03-09 DIAGNOSIS — I824Y2 Acute embolism and thrombosis of unspecified deep veins of left proximal lower extremity: Secondary | ICD-10-CM

## 2016-03-09 HISTORY — PX: CARDIAC CATHETERIZATION: SHX172

## 2016-03-09 HISTORY — PX: PERIPHERAL VASCULAR CATHETERIZATION: SHX172C

## 2016-03-09 LAB — FIBRINOGEN: Fibrinogen: 61 mg/dL — CL (ref 210–475)

## 2016-03-09 LAB — HEMOGLOBIN A1C
Hgb A1c MFr Bld: 10.6 % — ABNORMAL HIGH (ref 4.8–5.6)
MEAN PLASMA GLUCOSE: 258 mg/dL

## 2016-03-09 LAB — CBC
HCT: 40 % (ref 39.0–52.0)
HEMATOCRIT: 39.4 % (ref 39.0–52.0)
HEMOGLOBIN: 13.6 g/dL (ref 13.0–17.0)
HEMOGLOBIN: 14 g/dL (ref 13.0–17.0)
MCH: 28.6 pg (ref 26.0–34.0)
MCH: 29.4 pg (ref 26.0–34.0)
MCHC: 34 g/dL (ref 30.0–36.0)
MCHC: 35.5 g/dL (ref 30.0–36.0)
MCV: 84 fL (ref 78.0–100.0)
MCV: 82.6 fL (ref 78.0–100.0)
Platelets: 187 10*3/uL (ref 150–400)
Platelets: 162 10*3/uL (ref 150–400)
RBC: 4.76 MIL/uL (ref 4.22–5.81)
RBC: 4.77 MIL/uL (ref 4.22–5.81)
RDW: 13.5 % (ref 11.5–15.5)
RDW: 13.1 % (ref 11.5–15.5)
WBC: 8.4 10*3/uL (ref 4.0–10.5)
WBC: 10.8 10*3/uL — AB (ref 4.0–10.5)

## 2016-03-09 LAB — GLUCOSE, CAPILLARY
GLUCOSE-CAPILLARY: 245 mg/dL — AB (ref 65–99)
Glucose-Capillary: 273 mg/dL — ABNORMAL HIGH (ref 65–99)
Glucose-Capillary: 242 mg/dL — ABNORMAL HIGH (ref 65–99)
Glucose-Capillary: 156 mg/dL — ABNORMAL HIGH (ref 65–99)

## 2016-03-09 LAB — HEPARIN LEVEL (UNFRACTIONATED): HEPARIN UNFRACTIONATED: 0.41 [IU]/mL (ref 0.30–0.70)

## 2016-03-09 SURGERY — LOWER EXTREMITY VENOGRAPHY

## 2016-03-09 MED ORDER — ONDANSETRON HCL 4 MG/2ML IJ SOLN: 4.0000 mg | Freq: Four times a day (QID) | INTRAMUSCULAR | Status: DC | PRN

## 2016-03-09 MED ORDER — SODIUM CHLORIDE 0.9 % IV SOLN
1.0000 mg/h | INTRAVENOUS | Status: DC
  Filled 2016-03-09 (×3): qty 10

## 2016-03-09 MED ORDER — FENTANYL CITRATE (PF) 100 MCG/2ML IJ SOLN
INTRAMUSCULAR | Status: DC | PRN
  Administered 2016-03-09: 50 ug via INTRAVENOUS

## 2016-03-09 MED ORDER — SODIUM CHLORIDE 0.9% FLUSH: 3.0000 mL | INTRAVENOUS | Status: DC | PRN

## 2016-03-09 MED ORDER — HEPARIN (PORCINE) IN NACL 100-0.45 UNIT/ML-% IJ SOLN
1800.0000 [IU]/h | INTRAMUSCULAR | Status: AC
  Filled 2016-03-09: qty 250

## 2016-03-09 MED ORDER — HEPARIN (PORCINE) IN NACL 2-0.9 UNIT/ML-% IJ SOLN
INTRAMUSCULAR | Status: AC
  Filled 2016-03-09: qty 1000

## 2016-03-09 MED ORDER — SODIUM CHLORIDE 0.9 % IV SOLN
1.0000 mg/h | INTRAVENOUS | Status: DC
  Filled 2016-03-09 (×2): qty 10

## 2016-03-09 MED ORDER — HEPARIN (PORCINE) IN NACL 2-0.9 UNIT/ML-% IJ SOLN
INTRAMUSCULAR | Status: DC | PRN
  Administered 2016-03-09: 1000 mL

## 2016-03-09 MED ORDER — METFORMIN HCL 500 MG PO TABS
1000.0000 mg | ORAL_TABLET | Freq: Every day | ORAL | Status: DC
  Administered 2016-03-10: 1000 mg via ORAL
  Filled 2016-03-09 (×3): qty 2

## 2016-03-09 MED ORDER — MORPHINE SULFATE (PF) 10 MG/ML IV SOLN: 5.0000 mg | INTRAVENOUS | Status: DC | PRN

## 2016-03-09 MED ORDER — MIDAZOLAM HCL 2 MG/2ML IJ SOLN: 1.0000 mg | INTRAMUSCULAR | Status: DC | PRN

## 2016-03-09 MED ORDER — SODIUM CHLORIDE 0.9 % IV SOLN: 250.0000 mL | INTRAVENOUS | Status: DC | PRN

## 2016-03-09 MED ORDER — SODIUM CHLORIDE 0.9 % IV SOLN
INTRAVENOUS | Status: AC
  Administered 2016-03-09: 15:00:00 via INTRAVENOUS
  Filled 2016-03-09: qty 10

## 2016-03-09 MED ORDER — MUPIROCIN 2 % EX OINT
1.0000 "application " | TOPICAL_OINTMENT | Freq: Two times a day (BID) | CUTANEOUS | Status: DC
  Administered 2016-03-09 – 2016-03-11 (×4): 1 via NASAL
  Filled 2016-03-09 (×3): qty 22

## 2016-03-09 MED ORDER — SODIUM CHLORIDE 0.9% FLUSH: 3.0000 mL | Freq: Two times a day (BID) | INTRAVENOUS | Status: DC

## 2016-03-09 MED ORDER — LIDOCAINE HCL (PF) 1 % IJ SOLN
INTRAMUSCULAR | Status: DC | PRN
Start: 2016-03-09 — End: 2016-03-09
  Administered 2016-03-09: 15 mL

## 2016-03-09 MED ORDER — HEPARIN SODIUM (PORCINE) 1000 UNIT/ML IJ SOLN
INTRAMUSCULAR | Status: AC
  Filled 2016-03-09: qty 1

## 2016-03-09 MED ORDER — MIDAZOLAM HCL 2 MG/2ML IJ SOLN
INTRAMUSCULAR | Status: DC | PRN
  Administered 2016-03-09: 1 mg via INTRAVENOUS

## 2016-03-09 MED ORDER — SODIUM CHLORIDE 0.9% FLUSH
3.0000 mL | Freq: Two times a day (BID) | INTRAVENOUS | Status: DC
Start: 2016-03-09 — End: 2016-03-11
  Administered 2016-03-10: 3 mL via INTRAVENOUS

## 2016-03-09 MED ORDER — OXYCODONE-ACETAMINOPHEN 5-325 MG PO TABS
1.0000 | ORAL_TABLET | Freq: Four times a day (QID) | ORAL | Status: DC | PRN
  Administered 2016-03-10 – 2016-03-11 (×4): 2 via ORAL
  Filled 2016-03-09 (×5): qty 2

## 2016-03-09 MED ORDER — LIDOCAINE HCL (PF) 1 % IJ SOLN
INTRAMUSCULAR | Status: AC
  Filled 2016-03-09: qty 30

## 2016-03-09 MED ORDER — FENTANYL CITRATE (PF) 100 MCG/2ML IJ SOLN
INTRAMUSCULAR | Status: AC
  Filled 2016-03-09: qty 2

## 2016-03-09 MED ORDER — MIDAZOLAM HCL 2 MG/2ML IJ SOLN
INTRAMUSCULAR | Status: AC
  Filled 2016-03-09: qty 2

## 2016-03-09 MED ORDER — CHLORHEXIDINE GLUCONATE CLOTH 2 % EX PADS
6.0000 | MEDICATED_PAD | Freq: Every day | CUTANEOUS | Status: DC
  Administered 2016-03-09 – 2016-03-10 (×2): 6 via TOPICAL

## 2016-03-09 MED ORDER — HEPARIN (PORCINE) IN NACL 100-0.45 UNIT/ML-% IJ SOLN: 800.0000 [IU]/h | INTRAMUSCULAR | Status: DC

## 2016-03-09 MED ORDER — MORPHINE SULFATE (PF) 2 MG/ML IV SOLN: 2.0000 mg | INTRAVENOUS | Status: DC | PRN

## 2016-03-09 MED ORDER — IODIXANOL 320 MG/ML IV SOLN
INTRAVENOUS | Status: DC | PRN
  Administered 2016-03-09: 50 mL via INTRAVENOUS

## 2016-03-09 MED ORDER — SODIUM CHLORIDE 0.9 % IV SOLN: INTRAVENOUS | Status: DC

## 2016-03-09 MED ORDER — HEPARIN SODIUM (PORCINE) 1000 UNIT/ML IJ SOLN
INTRAMUSCULAR | Status: DC | PRN
  Administered 2016-03-09: 1500 [IU] via INTRAVENOUS

## 2016-03-09 SURGICAL SUPPLY — 16 items
CATH PULSESPRAY 5F 20CMX135CM (CATHETERS) ×2 IMPLANT
CATH SOFT-VU ST 4F 90CM (CATHETERS) ×2 IMPLANT
CATH VISIONS PV .035 IVUS (CATHETERS) ×2 IMPLANT
COVER PRB 48X5XTLSCP FOLD TPE (BAG) IMPLANT
COVER PROBE 5X48 (BAG) ×4
KIT MICROINTRODUCER STIFF 5F (SHEATH) ×3 IMPLANT
KIT PV (KITS) ×4 IMPLANT
SET ZELANTE DVT THROMB (CATHETERS) ×2 IMPLANT
SHEATH PINNACLE 8F 10CM (SHEATH) ×2 IMPLANT
STOPCOCK MORSE 400PSI 3WAY (MISCELLANEOUS) ×2 IMPLANT
SYR MEDRAD MARK V 150ML (SYRINGE) ×4 IMPLANT
TRANSDUCER W/STOPCOCK (MISCELLANEOUS) ×4 IMPLANT
TRAY PV CATH (CUSTOM PROCEDURE TRAY) ×4 IMPLANT
WIRE BENTSON .035X145CM (WIRE) ×2 IMPLANT
WIRE ROSEN-J .035X260CM (WIRE) ×2 IMPLANT
WIRE TORQFLEX AUST .018X40CM (WIRE) ×2 IMPLANT

## 2016-03-09 NOTE — Progress Notes (Signed)
   Daily Progress Note   Pt feels like L leg is better.  On exam, left calf is marked softer with decreased turgor.  All lines are appropriately positioned and connected with tPA and Heparin drip running.  No bleeding from cannulation site.  Laboratory CBC    Component Value Date/Time   WBC 10.8 (H) 03/09/2016 1807   HGB 14.0 03/09/2016 1807   HCT 39.4 03/09/2016 1807   PLT 162 03/09/2016 1807    BMET    Component Value Date/Time   NA 137 03/06/2016 1742   K 4.0 03/06/2016 1742   CL 103 03/06/2016 1742   CO2 22 03/06/2016 1742   GLUCOSE 318 (H) 03/06/2016 1742   BUN <5 (L) 03/06/2016 1742   CREATININE 0.62 03/06/2016 1742   CALCIUM 9.3 03/06/2016 1742   GFRNONAA >60 03/06/2016 1742   GFRAA >60 03/06/2016 1742     Leonides SakeBrian Roland Prine, MD, FACS [facility] [place] Office: [phone] Pager: [phone]  03/09/2016, 7:21 PM

## 2016-03-09 NOTE — Op Note (Signed)
OPERATIVE NOTE   PROCEDURE: 1.  Left popliteal vein cannulation under ultrasound guidance 2.  Placement of catheter in left femoropopliteal vein and inferior vena cava  3.  Intravascular ultrasound of left femoropopliteal vein, external iliac vein and inferior vena cava  4.  Left leg ascending venography 5.  Inferior vena cavagram 6.  Rheolytic thrombectomy of left femoropopliteal and external iliac veins 7.  Placement of thrombolytic catheter into left femoropopliteal and external iliac vein 8.  Conscious sedation for 75 minutes  PRE-OPERATIVE DIAGNOSIS: left leg iliofemoral deep vein thrombosis   POST-OPERATIVE DIAGNOSIS: same as above   SURGEON: Adele Barthel, MD  ANESTHESIA: conscious sedation  ESTIMATED BLOOD LOSS: 50 cc  CONTRAST: 50 cc  FINDING(S):  Total occlusion of left femoropopliteal vein with patent profunda femoral vein.    Total occlusion of left external iliac vein  Patent left common iliac vein, internal iliac vein, and inferior vena cava   IVUS measurements:  Common femoral vein: 151.6 mm2, 12.8 mm x 15 mm diameter  External iliac vein: 87.3 mm2. 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 male who presents with extensive left iliofemoral deep vein thrombosis.  Dr. Oneida Alar felt this patient would benefit from pharmacomechanical thrombolysis of his deep vein thrombosis.  The patient presents for: Left leg ascending venogram, possible Angiojet thrombecomy of left leg venous system, intravascular ultrasound, and placement of thrombolytic catheter.  I discussed with the patient the nature of angiographic procedures, especially the limited patencies of any endovascular intervention.  The patient is aware of that the risks  of an angiographic procedure include but are not limited to: bleeding, infection, access site complications, renal failure, embolization, rupture of vessel, dissection, possible need for emergent surgical intervention, possible need for surgical procedures to treat the patient's pathology, and stroke and death.  I made the patient aware of the bleed risk of thrombolsis and renal failure risks of Angiojet thrombectomy.  The patient is aware of the risks and agrees to proceed.  DESCRIPTION: After full informed consent was obtained from the patient, the patient was brought back to the angiography suite.  The patient was placed supine upon the angiography table and connected to cardiopulmonary monitoring equipment.  The patient was then given conscious sedation, the amounts of which are documented in the patient's chart.  A circulating radiologic technician maintained continuous monitoring of the patient's cardiopulmonary status.  Additionally, the control room radiologic technician provided backup monitoring throughout the procedure.  The patient was prepped and drape in the standard fashion for an ascending venography.  At this point, the patient was positioned into prone position.  I cannulated one of the popliteal veins under ultrasound guidance with a micropuncture needle.  I loaded the microwire into the vein.  I exchanged the needle for the microsheath.  I then tried to advance a Bentson wire, but met with significant resistance.  I removed the wire and did a hand injection.  This demonstrated extensive occlusive femoropopliteal thrombus but intravenous access.  I placed a Glidewire through the sheath and advanced the wire into the inferior vena cava.  I exchanged the sheath for a 8-Fr sheath which was placed into the distal femoral vein.  At this point, I loaded a straight catheter and advanced it to the inferior vena cava.  I did a hand injection to verify patency of the inferior vena cava and re-entry  into the cava.  I loaded a 0.035" IVUS catheter over the wire.  I imaged the femoropopliteal vein, external iliac vein, internal iliac vein, common iliac vein, and inferior vena cava.  The measurements are noted above.  The femoropopliteal and external iliac vein were occluded and the left common iliac vein appeared compressed >50% by the common iliac artery.   At this point, I loaded the Angiojet Xalente catheter and dispersed 10 mg of tPA in Pulsespray mode throughout the occluded segment.  I allowed the thrombus to lyse for 20 minutes and then perform rheolytic thrombectomy on the left femoropopliteal and iliac veins.  I did a hand injection which demonstrated 50-75% resolution of the femoropopliteal thrombus up to the common femoral vein.  There remained occlusive thrombus proximal so I performed some additional rheolytic thrombectomy from the common femoral vein up to common iliac vein.  In total, I performed ~200 sec of rheolytic thrombectomy.  Due to the residual thrombus, I elected to place an Angiojet thrombolytic catheter.  I placed the infusion segment of the catheter slightly into the common iliac vein and overlapping the problematic thrombosed segment over the wire.  The wire was removed and then the central cannula was loaded into the catheter.  The catheter was tied to the sheath to prevent movement of the catheter.  The side port of the sheath was connected to Heparin drip, running at 500 units/hr.  The infusion catheter was connected to the tPA drip at 1 mg/hr.    The sheath and catheter were secure additionally with Tegraderms.  The patient will drip overnight and return to the angiography suite tomorrow to reimage the left leg.  Further interventions were be scheduled at that time.   COMPLICATIONS: none  CONDITION: stable   Adele Barthel, MD [facility] [place] Office: [phone] Pager: [phone]  03/09/2016, 4:01 PM

## 2016-03-09 NOTE — Progress Notes (Signed)
CRITICAL VALUE ALERT  Critical value received:  Fibrinogen = 61  Date of notification:  03/09/16  Time of notification:  2130  Critical value read back:Yes.    Nurse who received alert:  Wilhelmina McardleLauren Hadiyah Maricle, RN  MD notified (1st page):  Dr. Imogene Burnhen  Time of first page:  2133  MD notified (2nd page):  Time of second page:  Responding MD:  Dr. Imogene Burnhen  Time MD responded:  2135  Orders received to discontinue TPA.  Monitor for additional bleeding.  Notify Dr. Imogene Burnhen if other overt bleeding begins.

## 2016-03-09 NOTE — Interval H&P Note (Signed)
History and Physical Interval Note:  03/09/2016 2:12 PM  [name]  has presented today for surgery, with the diagnosis of dvt of LLE  The various methods of treatment have been discussed with the patient and family. After consideration of risks, benefits and other options for treatment, the patient has consented to  Procedure(s): Lower Extremity Venography (Left) as a surgical intervention .  The patient's history has been reviewed, patient examined, no change in status, stable for surgery.  I have reviewed the patient's chart and labs.  Questions were answered to the patient's satisfaction.     Leonides SakeBrian Taelyr Jantz

## 2016-03-09 NOTE — Progress Notes (Signed)
ANTICOAGULATION CONSULT NOTE - Follow Up Consult  Pharmacy Consult for Heparin Indication: DVT  No Known Allergies  Patient Measurements: Height: 6\' 1"  (185.4 cm) Weight: 265 lb 14.4 oz (120.6 kg) IBW/kg (Calculated) : 79.9 Heparin Dosing Weight:  107 kg  Vital Signs: Temp: 97.9 F (36.6 C) (12/21 0613) Temp Source: Oral (12/21 0613) BP: 132/75 (12/21 0613) Pulse Rate: 71 (12/21 0613)  Labs:  Recent Labs  03/06/16 1742 03/08/16 0026 03/08/16 0820 03/09/16 0337  HGB 15.1 13.4  --  13.6  HCT 42.9 38.4*  --  40.0  PLT 201 199  --  187  APTT  --   --  71*  --   HEPARINUNFRC  --  0.54 0.57 0.41  CREATININE 0.62  --   --   --     Estimated Creatinine Clearance: 142 mL/min (by C-G formula based on SCr of 0.62 mg/dL).  Assessment:  Anticoag: Heparin for new left leg DVT. Heparin level (0.41) remains therapeutic on 1800 units/hr. aPTT 71, CBC stable. No bleeding noted per chart. Thrombolysis moved to Thursday 12/21  Pt was seen in the ED on 12/18 with DVT, prescribed xarelto, received one dose 15mg  12/18 PM, but pt hasn't filled the prescription yet    Goal of Therapy:  Heparin level 0.3-0.7 units/ml Monitor platelets by anticoagulation protocol: Yes   Plan:  Continue IV heparin 1,800 units/hr Monitor daily heparin level, CBC, s/s of bleed F/u plans for oral anticoagulation after thrombolysis.   Beverly Suriano S. Merilynn Finlandobertson, PharmD, BCPS Clinical Staff Pharmacist Pager [phone]  Misty Stanleyobertson, Rhena Glace Stillinger 03/09/2016,10:37 AM

## 2016-03-09 NOTE — Progress Notes (Signed)
[facility]  Subjective  - leg still swollen   Objective 132/75 71 97.9 F (36.6 C) (Oral) 18 100%  Intake/Output Summary (Last 24 hours) at 03/09/16 0748 Last data filed at 03/08/16 1900  Gross per 24 hour  Intake             2876 ml  Output                0 ml  Net             2876 ml   Left leg unchanged  Assessment/Planning: Left leg DVT thrombolysis today Start lantus tomorrow with current NPO issues per diabetes team  Roberto BrunsFields, Roberto Sosa 03/09/2016 7:48 AM --  Laboratory Lab Results:  Recent Labs  03/08/16 0026 03/09/16 0337  WBC 10.2 8.4  HGB 13.4 13.6  HCT 38.4* 40.0  PLT 199 187   BMET  Recent Labs  03/06/16 1742  NA 137  K 4.0  CL 103  CO2 22  GLUCOSE 318*  BUN <5*  CREATININE 0.62  CALCIUM 9.3    COAG No results found for: INR, PROTIME No results found for: PTT

## 2016-03-09 NOTE — Progress Notes (Signed)
eLink Physician-Brief Progress Note Patient Name: Jannifer HickJeff Loeb DOB: 1961-02-02 MRN: [id number]   Date of Service  03/09/2016  HPI/Events of Note  New patient evaluation  eICU Interventions  Nothing further to add     Intervention Category Major Interventions: Other:  Ardythe Klute 03/09/2016, 6:56 PM

## 2016-03-10 ENCOUNTER — Encounter (HOSPITAL_COMMUNITY): Payer: Self-pay | Admitting: Vascular Surgery

## 2016-03-10 ENCOUNTER — Ambulatory Visit (INDEPENDENT_AMBULATORY_CARE_PROVIDER_SITE_OTHER): Payer: Self-pay | Admitting: Orthopaedic Surgery

## 2016-03-10 ENCOUNTER — Encounter (HOSPITAL_COMMUNITY)
Admission: AD | Disposition: A | Discharge status: Home / Self Care | Payer: Self-pay | Source: Ambulatory Visit | Attending: Vascular Surgery

## 2016-03-10 HISTORY — PX: CARDIAC CATHETERIZATION: SHX172

## 2016-03-10 HISTORY — PX: PERIPHERAL VASCULAR CATHETERIZATION: SHX172C

## 2016-03-10 LAB — BASIC METABOLIC PANEL
ANION GAP: 8 (ref 5–15)
BUN: 10 mg/dL (ref 6–20)
CHLORIDE: 108 mmol/L (ref 101–111)
CO2: 22 mmol/L (ref 22–32)
CREATININE: 1.37 mg/dL — AB (ref 0.61–1.24)
Calcium: 8.4 mg/dL — ABNORMAL LOW (ref 8.9–10.3)
GFR calc Af Amer: >60 mL/min (ref >60)
GFR calc non Af Amer: 57 mL/min — ABNORMAL LOW (ref >60)
Glucose, Bld: 256 mg/dL — ABNORMAL HIGH (ref 65–99)
Potassium: 3.6 mmol/L (ref 3.5–5.1)
Sodium: 138 mmol/L (ref 135–145)

## 2016-03-10 LAB — CBC
HCT: 38.5 % — ABNORMAL LOW (ref 39.0–52.0)
HCT: 38.7 % — ABNORMAL LOW (ref 39.0–52.0)
HEMOGLOBIN: 13.5 g/dL (ref 13.0–17.0)
HEMOGLOBIN: 13.3 g/dL (ref 13.0–17.0)
MCH: 28.4 pg (ref 26.0–34.0)
MCH: 28.3 pg (ref 26.0–34.0)
MCHC: 35.1 g/dL (ref 30.0–36.0)
MCHC: 34.4 g/dL (ref 30.0–36.0)
MCV: 81.1 fL (ref 78.0–100.0)
MCV: 82.3 fL (ref 78.0–100.0)
PLATELETS: 149 10*3/uL — AB (ref 150–400)
Platelets: 157 10*3/uL (ref 150–400)
RBC: 4.75 MIL/uL (ref 4.22–5.81)
RBC: 4.7 MIL/uL (ref 4.22–5.81)
RDW: 13 % (ref 11.5–15.5)
RDW: 13.1 % (ref 11.5–15.5)
WBC: 10 10*3/uL (ref 4.0–10.5)
WBC: 8.7 10*3/uL (ref 4.0–10.5)

## 2016-03-10 LAB — GLUCOSE, CAPILLARY
GLUCOSE-CAPILLARY: 238 mg/dL — AB (ref 65–99)
GLUCOSE-CAPILLARY: 203 mg/dL — AB (ref 65–99)
GLUCOSE-CAPILLARY: 211 mg/dL — AB (ref 65–99)
Glucose-Capillary: 148 mg/dL — ABNORMAL HIGH (ref 65–99)
Glucose-Capillary: 281 mg/dL — ABNORMAL HIGH (ref 65–99)

## 2016-03-10 LAB — HEPARIN LEVEL (UNFRACTIONATED): Heparin Unfractionated: <0.1 IU/mL — ABNORMAL LOW (ref 0.30–0.70)

## 2016-03-10 LAB — FIBRINOGEN
Fibrinogen: 157 mg/dL — ABNORMAL LOW (ref 210–475)
Fibrinogen: 204 mg/dL — ABNORMAL LOW (ref 210–475)

## 2016-03-10 LAB — POCT ACTIVATED CLOTTING TIME: Activated Clotting Time: 136 seconds

## 2016-03-10 SURGERY — LOWER EXTREMITY VENOGRAPHY
Laterality: Left

## 2016-03-10 MED ORDER — SODIUM CHLORIDE 0.9 % IV SOLN: INTRAVENOUS | Status: DC

## 2016-03-10 MED ORDER — ONDANSETRON HCL 4 MG/2ML IJ SOLN
4.0000 mg | Freq: Four times a day (QID) | INTRAMUSCULAR | Status: DC | PRN
Start: 2016-03-10 — End: 2016-03-11

## 2016-03-10 MED ORDER — HYDRALAZINE HCL 20 MG/ML IJ SOLN: 5.0000 mg | INTRAMUSCULAR | Status: DC | PRN

## 2016-03-10 MED ORDER — IODIXANOL 320 MG/ML IV SOLN
INTRAVENOUS | Status: DC | PRN
  Administered 2016-03-10: 10 mL via INTRAVENOUS

## 2016-03-10 MED ORDER — SODIUM CHLORIDE 0.45 % IV SOLN
INTRAVENOUS | Status: DC
  Administered 2016-03-10 – 2016-03-11 (×3): via INTRAVENOUS

## 2016-03-10 MED ORDER — LIDOCAINE HCL (PF) 1 % IJ SOLN
INTRAMUSCULAR | Status: DC | PRN
  Administered 2016-03-10: 8 mL via SUBCUTANEOUS

## 2016-03-10 MED ORDER — METOPROLOL TARTRATE 5 MG/5ML IV SOLN: 2.0000 mg | INTRAVENOUS | Status: DC | PRN

## 2016-03-10 MED ORDER — DOCUSATE SODIUM 100 MG PO CAPS
100.0000 mg | ORAL_CAPSULE | Freq: Every day | ORAL | Status: DC
  Administered 2016-03-11: 100 mg via ORAL
  Filled 2016-03-10: qty 1

## 2016-03-10 MED ORDER — HEPARIN SODIUM (PORCINE) 1000 UNIT/ML IJ SOLN
INTRAMUSCULAR | Status: DC | PRN
  Administered 2016-03-10: 1500 [IU] via INTRAVENOUS

## 2016-03-10 MED ORDER — RIVAROXABAN 15 MG PO TABS
15.0000 mg | ORAL_TABLET | Freq: Two times a day (BID) | ORAL | Status: DC
  Administered 2016-03-10 – 2016-03-11 (×2): 15 mg via ORAL
  Filled 2016-03-10 (×2): qty 1

## 2016-03-10 MED ORDER — ALUM & MAG HYDROXIDE-SIMETH 200-200-20 MG/5ML PO SUSP
15.0000 mL | ORAL | Status: DC | PRN
Start: 2016-03-10 — End: 2016-03-11

## 2016-03-10 MED ORDER — PHENOL 1.4 % MT LIQD: 1.0000 | OROMUCOSAL | Status: DC | PRN

## 2016-03-10 MED ORDER — HEPARIN (PORCINE) IN NACL 2-0.9 UNIT/ML-% IJ SOLN
INTRAMUSCULAR | Status: DC | PRN
  Administered 2016-03-10: 1000 mL

## 2016-03-10 MED ORDER — GUAIFENESIN-DM 100-10 MG/5ML PO SYRP: 15.0000 mL | ORAL_SOLUTION | ORAL | Status: DC | PRN

## 2016-03-10 MED ORDER — LABETALOL HCL 5 MG/ML IV SOLN: 10.0000 mg | INTRAVENOUS | Status: DC | PRN

## 2016-03-10 MED ORDER — PANTOPRAZOLE SODIUM 40 MG PO TBEC
40.0000 mg | DELAYED_RELEASE_TABLET | Freq: Every day | ORAL | Status: DC
  Administered 2016-03-10 – 2016-03-11 (×2): 40 mg via ORAL
  Filled 2016-03-10 (×2): qty 1

## 2016-03-10 MED ORDER — LIVING WELL WITH DIABETES BOOK
Freq: Once | Status: AC
  Administered 2016-03-10: 16:00:00
  Filled 2016-03-10: qty 1

## 2016-03-10 SURGICAL SUPPLY — 9 items
CATH VISIONS PV .035 IVUS (CATHETERS) ×2 IMPLANT
KIT PV (KITS) ×3 IMPLANT
SET ZELANTE DVT THROMB (CATHETERS) ×2 IMPLANT
SHEATH FAST CATH 10F 12CM (SHEATH) ×4 IMPLANT
SHEATH PINNACLE 8F 10CM (SHEATH) ×4 IMPLANT
STENT WALLSTENT 12X20X75 (Permanent Stent) ×4 IMPLANT
TRANSDUCER W/STOPCOCK (MISCELLANEOUS) ×1 IMPLANT
TRAY PV CATH (CUSTOM PROCEDURE TRAY) ×3 IMPLANT
WIRE HI TORQ VERSACORE J 260CM (WIRE) ×2 IMPLANT

## 2016-03-10 NOTE — Op Note (Signed)
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 The patient was placed in prone position on the Angio table. There was a pre-existing sheath in the left popliteal fossa. This was prepped and draped in usual sterile fashion. Next an 035 versacore wire was placed through the pre-existing popliteal sheath and this was exchanged for a clean 9 [place]French sheath. An IVIS catheter was then placed through this sheath all the way up to the level of the inferior vena cava. Inferior vena cava was widely patent. There was severe narrowing greater than 90% of the takeoff of the left common iliac vein. The vein then dilated back out and there was again severe greater than 90% stenosis of the left external iliac vein. Pulling the catheter down into the common femoral vein and this was widely patent. The left superficial femoral vein about midportion still had some residual thrombus lining the edge. There is also some thrombus in the left popliteal vein. At this point a salon take catheter was placed over the guidewire up to the level of the proximal superficial femoral vein. This was then moved up and down the superficial femoral and popliteal vein in small increments for a total of 200 seconds. The IVIS  catheter was then placed back into the sheath and up to the level of the iliac bifurcation. Measurements were made for stenting of the common iliac and external iliac narrowing. We then advanced a 16 x 90 Wallstent but due to the placement of the popliteal sheath stent would not reach all the way to the iliac bifurcation. Therefore I deployed this extending from the distal external iliac vein into the midportion of the common iliac vein. The guidewire and delivery system was then removed. The patient was then placed in a supine position. The left groin was prepped and draped in usual sterile fashion. Ultrasound was used to identify the left common femoral vein. An introducer needle was used to cannulate the left common femoral vein. An 22035 versacore wire was then threaded up into the inferior vena cava. Again using IVIS guidance we marked the location of the narrowing of the common iliac vein and then advanced an additional 16 x 90 Wallstent up to this level and deployed this. Completion IVIS was performed. Iliac vein diameter went from 31 mm pre-239 mm post in the common iliac vein. Vein diameter in the external iliac went from 28 mm pre-221 mm post. At this point the guidewire and delivery system and IVIS catheter were removed. The femoral and popliteal sheaths were then removed and the patient taken to the holding area in stable condition.  The patient will be continued on his heparin drip for now we will start him on Xarelto this evening. He has already received a Xarelto starter pack. We will check into his financial situation regarding payment for this as he will most likely be on long-term  anticoagulation. Otherwise we may need to consider switching him over to warfarin for financial reasons.  Fabienne Brunsharles Arrow Tomko, MD [facility] [place] Office: [phone] Pager: [phone]

## 2016-03-10 NOTE — Significant Event (Signed)
Patient to be transfer to 2W09 from cath lab. RN called report to 2W [employer] Cathy. Belongings at bedside of 2S15 taken there (clothes, cell phone charger). Patient has with him his cellphone to the cath lab.

## 2016-03-10 NOTE — Interval H&P Note (Signed)
History and Physical Interval Note:  03/10/2016 12:24 PM  Roberto Sosa  has presented today for surgery, with the diagnosis of PVD  The various methods of treatment have been discussed with the patient and family. After consideration of risks, benefits and other options for treatment, the patient has consented to  Procedure(s): Lower Extremity Angiography (N/A) as a surgical intervention .  The patient's history has been reviewed, patient examined, no change in status, stable for surgery.  I have reviewed the patient's chart and labs.  Questions were answered to the patient's satisfaction.     Fabienne BrunsFields, Charles

## 2016-03-10 NOTE — H&P (View-Only) (Signed)
   Daily Progress Note   Pt feels like L leg is better.  On exam, left calf is marked softer with decreased turgor.  All lines are appropriately positioned and connected with tPA and Heparin drip running.  No bleeding from cannulation site.  Laboratory CBC    Component Value Date/Time   WBC 10.8 (H) 03/09/2016 1807   HGB 14.0 03/09/2016 1807   HCT 39.4 03/09/2016 1807   PLT 162 03/09/2016 1807    BMET    Component Value Date/Time   NA 137 03/06/2016 1742   K 4.0 03/06/2016 1742   CL 103 03/06/2016 1742   CO2 22 03/06/2016 1742   GLUCOSE 318 (H) 03/06/2016 1742   BUN <5 (L) 03/06/2016 1742   CREATININE 0.62 03/06/2016 1742   CALCIUM 9.3 03/06/2016 1742   GFRNONAA >60 03/06/2016 1742   GFRAA >60 03/06/2016 1742     Brian Chen, MD, FACS [facility] Office: [phone] Pager: [phone]  03/09/2016, 7:21 PM     

## 2016-03-10 NOTE — Progress Notes (Signed)
03/10/2016 1440 Received pt to room 2W07 from Cath Lab.  Pt is A&O, no c.o voiced.  Tele monitor applied and CCMD notified.  Pt is on Bedrest till 5pm.  Oriented to room, call light and bed.  Call bell in reach. Kathryne HitchAllen, [place] Hedman C

## 2016-03-10 NOTE — Care Management Note (Signed)
Case Management Note Roberto PieriniKristi Cristi Gwynn RN, BSN Unit 2W-Case Manager [phone]  Patient Details  Name: Roberto HickJeff Sosa MRN: [id number] Date of Birth: 11/03/1960  Subjective/Objective:   Pt admitted with DVT                 Action/Plan: PTA pt lived at home- referral received for Xarelto- pt has not insurance- per conversation with pt he is under workers comp- but this is not in epic- and would not cover Xarelto longterm anyway- pt given 30 day free card and application for pt assistance- per pt he is not sure he wants to be on Xarelto vs Coumadin- advised pt to discuss with MD.   Expected Discharge Date:                  Expected Discharge Plan:  Home/Self Care  In-House Referral:     Discharge planning Services  CM Consult, Medication Assistance  Post Acute Care Choice:    Choice offered to:     DME Arranged:    DME Agency:     HH Arranged:    HH Agency:     Status of Service:  Completed, signed off  If discussed at [employer] Meetings, dates discussed:    Additional Comments:  Darrold SpanWebster, Jadier Rockers Hall, RN 03/10/2016, 4:40 PM

## 2016-03-10 NOTE — Progress Notes (Signed)
Inpatient Diabetes Program Recommendations  AACE/ADA: New Consensus Statement on Inpatient Glycemic Control (2015)  Target Ranges:  Prepandial:   less than 140 mg/dL      Peak postprandial:   less than 180 mg/dL (1-2 hours)      Critically ill patients:  140 - 180 mg/dL   Lab Results  Component Value Date   GLUCAP 238 (H) 03/10/2016   HGBA1C 10.6 (H) 03/06/2016    Review of Glycemic Control:  Results for Roberto Sosa, Roberto Sosa (MRN [id number]) as of 03/10/2016 11:34  Ref. Range 03/09/2016 06:07 03/09/2016 11:20 03/09/2016 12:27 03/09/2016 16:12 03/10/2016 07:28  Glucose-Capillary Latest Ref Range: 65 - 99 mg/dL 409273 (H) 811242 (H) 914245 (H) 156 (H) 238 (H)   Diabetes history: Type 2 diabetes Outpatient Diabetes medications: Metformin 1000 mg bid Current orders for Inpatient glycemic control:  Novolog resistant tid with meals, Metformin 1000 mg with breakfast  Inpatient Diabetes Program Recommendations:    Note elevated A1C=10.9%.  Please consider adding basal insulin Lantus 25 units daily.    Thanks, Beryl MeagerJenny Britini Garcilazo, RN, BC-ADM Inpatient Diabetes Coordinator Pager [phone] (8a-5p)

## 2016-03-10 NOTE — Progress Notes (Signed)
ANTICOAGULATION CONSULT NOTE - Follow Up Consult  Pharmacy Consult for Heparin --> Xarelto Indication: DVT  No Known Allergies  Patient Measurements: Height: 6\' 1"  (185.4 cm) Weight: 265 lb 14.4 oz (120.6 kg) IBW/kg (Calculated) : 79.9 Heparin Dosing Weight:  107 kg  Vital Signs: Temp: 98.4 F (36.9 C) (12/22 1136) Temp Source: Oral (12/22 1136) BP: 126/74 (12/22 1419) Pulse Rate: 0 (12/22 1429)  Labs:  Recent Labs  03/08/16 0820  03/09/16 1807 03/09/16 2036 03/10/16 0247 03/10/16 0541 03/10/16 1122  HGB  --   < > 14.0  --   --  13.5 13.3  HCT  --   < > 39.4  --   --  38.5* 38.7*  PLT  --   < > 162  --   --  157 149*  APTT 71*  --   --   --   --   --   --   HEPARINUNFRC 0.57  < > <0.10* <0.10* <0.10* <0.10*  --   CREATININE  --   --   --   --   --  1.37*  --   < > = values in this interval not displayed.  Estimated Creatinine Clearance: 82.9 mL/min (by C-G formula based on SCr of 1.37 mg/dL (H)).  Assessment: 55 yo M presents on 12/18 with new left leg DVT. Pt was seen in the ED on 12/18 with DVT, prescribed Xarelto, received one dose 15mg  12/18 PM, but pt hasn't filled the prescription yet.    Anticoag: Heparin to transition to Xarelto for new left leg DVT s/p thrombolysis 12/21. No bleeding noted, CBC stable.  Goal of Therapy:  Full anticoagulation Monitor platelets by anticoagulation protocol: Yes   Plan:  Discontinue heparin drip at the time the first dose of Xarelto is given Xarelto 15 mg PO bid with meals for 21 days then 20 mg PO daily with dinner - to begin tonight Monitor for s/sx of bleeding   Loura BackJennifer Dolliver, PharmD, BCPS Clinical Pharmacist Phone for tonight [phone]  Main pharmacy - [phone] 03/10/2016 2:51 PM

## 2016-03-10 NOTE — Progress Notes (Signed)
ANTICOAGULATION CONSULT NOTE - Follow Up Consult  Pharmacy Consult for Heparin Indication: DVT  No Known Allergies  Patient Measurements: Height: 6\' 1"  (185.4 cm) Weight: 265 lb 14.4 oz (120.6 kg) IBW/kg (Calculated) : 79.9 Heparin Dosing Weight:  107 kg  Vital Signs: Temp: 98.2 F (36.8 C) (12/22 0730) Temp Source: Oral (12/22 0730) BP: 127/80 (12/22 1100) Pulse Rate: 66 (12/22 1100)  Labs:  Recent Labs  03/08/16 0820 03/09/16 0337 03/09/16 1807 03/09/16 2036 03/10/16 0247 03/10/16 0541  HGB  --  13.6 14.0  --   --  13.5  HCT  --  40.0 39.4  --   --  38.5*  PLT  --  187 162  --   --  157  APTT 71*  --   --   --   --   --   HEPARINUNFRC 0.57 0.41 <0.10* <0.10* <0.10* <0.10*  CREATININE  --   --   --   --   --  1.37*    Estimated Creatinine Clearance: 82.9 mL/min (by C-G formula based on SCr of 1.37 mg/dL (H)).  Assessment: 55 yo M presents on 12/18 with new left leg DVT. Pt was seen in the ED on 12/18 with DVT, prescribed xarelto, received one dose 15mg  12/18 PM, but pt hasn't filled the prescription yet    PMH: CAD, HLD, DM  Anticoag: Heparin for new left leg DVT. CBC stable. Thrombolysis 12/21, heparin at 500 units/hr post-op with TPA (stopped 12/21-2130). Resume heparin at higher rate today per Dr. Darrick PennaFields.  Goal of Therapy:  Heparin level 0.3-0.7 units/ml Monitor platelets by anticoagulation protocol: Yes   Plan:  Resume IV heparin 1,800 units/hr per Dr. Darrick PennaFields this AM Monitor daily heparin level, CBC, s/s of bleed F/u plans for oral anticoagulation after thrombolysis.  Michaline Kindig S. Merilynn Finlandobertson, PharmD, BCPS Clinical Staff Pharmacist Pager [phone]  Roberto Sosa, Roberto Sosa 03/10/2016,11:34 AM

## 2016-03-11 LAB — BASIC METABOLIC PANEL
Anion gap: 12 (ref 5–15)
BUN: 13 mg/dL (ref 6–20)
CHLORIDE: 106 mmol/L (ref 101–111)
CO2: 21 mmol/L — ABNORMAL LOW (ref 22–32)
Calcium: 8.3 mg/dL — ABNORMAL LOW (ref 8.9–10.3)
Creatinine, Ser: 1.73 mg/dL — ABNORMAL HIGH (ref 0.61–1.24)
GFR calc Af Amer: 49 mL/min — ABNORMAL LOW (ref >60)
GFR calc non Af Amer: 43 mL/min — ABNORMAL LOW (ref >60)
GLUCOSE: 267 mg/dL — AB (ref 65–99)
POTASSIUM: 3.6 mmol/L (ref 3.5–5.1)
Sodium: 139 mmol/L (ref 135–145)

## 2016-03-11 LAB — CBC
HEMATOCRIT: 38.7 % — AB (ref 39.0–52.0)
HEMOGLOBIN: 13.3 g/dL (ref 13.0–17.0)
MCH: 28.5 pg (ref 26.0–34.0)
MCHC: 34.4 g/dL (ref 30.0–36.0)
MCV: 82.9 fL (ref 78.0–100.0)
Platelets: 146 10*3/uL — ABNORMAL LOW (ref 150–400)
RBC: 4.67 MIL/uL (ref 4.22–5.81)
RDW: 13.3 % (ref 11.5–15.5)
WBC: 9.8 10*3/uL (ref 4.0–10.5)

## 2016-03-11 LAB — GLUCOSE, CAPILLARY: Glucose-Capillary: 262 mg/dL — ABNORMAL HIGH (ref 65–99)

## 2016-03-11 MED ORDER — GLIMEPIRIDE 2 MG PO TABS: 2.0000 mg | ORAL_TABLET | Freq: Every day | ORAL | 0 refills | Status: DC

## 2016-03-11 MED ORDER — GLIMEPIRIDE 4 MG PO TABS
2.0000 mg | ORAL_TABLET | Freq: Every day | ORAL | Status: DC
  Administered 2016-03-11: 2 mg via ORAL
  Filled 2016-03-11: qty 1

## 2016-03-11 MED ORDER — OXYCODONE-ACETAMINOPHEN 5-325 MG PO TABS: 1.0000 | ORAL_TABLET | Freq: Four times a day (QID) | ORAL | 0 refills | Status: DC | PRN

## 2016-03-11 NOTE — Progress Notes (Addendum)
Progress Note  03/11/2016 8:02 AM 1 Day Post-Op  Subjective:  Says his calf feels a lot better  Afebrile HR  60's-70's NSR 130's-140's systolic 99% RA  Vitals:   03/10/16 2049 03/11/16 0418  BP: (!) 143/75 139/75  Pulse: 72 65  Resp: 20 20  Temp: 97.8 F (36.6 C) 98 F (36.7 C)    Physical Exam: Lungs:  Non labored Incisions:  Dressing is clean and dry Extremities:  Left calf is soft and non tender   CBC    Component Value Date/Time   WBC 9.8 03/11/2016 0340   RBC 4.67 03/11/2016 0340   HGB 13.3 03/11/2016 0340   HCT 38.7 (L) 03/11/2016 0340   PLT 146 (L) 03/11/2016 0340   MCV 82.9 03/11/2016 0340   MCH 28.5 03/11/2016 0340   MCHC 34.4 03/11/2016 0340   RDW 13.3 03/11/2016 0340   LYMPHSABS 2.7 03/06/2016 1742   MONOABS 0.5 03/06/2016 1742   EOSABS 0.3 03/06/2016 1742   BASOSABS 0.0 03/06/2016 1742    BMET    Component Value Date/Time   NA 139 03/11/2016 0340   K 3.6 03/11/2016 0340   CL 106 03/11/2016 0340   CO2 21 (L) 03/11/2016 0340   GLUCOSE 267 (H) 03/11/2016 0340   BUN 13 03/11/2016 0340   CREATININE 1.73 (H) 03/11/2016 0340   CALCIUM 8.3 (L) 03/11/2016 0340   GFRNONAA 43 (L) 03/11/2016 0340   GFRAA 49 (L) 03/11/2016 0340    INR No results found for: INR   Intake/Output Summary (Last 24 hours) at 03/11/16 0802 Last data filed at 03/11/16 0600  Gross per 24 hour  Intake          2044.92 ml  Output             2700 ml  Net          -655.08 ml     Assessment:  55 y.o. male is s/p:  Left lower extremity venogram, left common and external iliac venous stent, mechanical thrombectomy left popliteal and superficial femoral vein, venous follow-up study, ultrasound of left groin, intravascular ultrasound of left popliteal superficial femoral external and common iliac vein  1 Day Post-Op  Plan: -pt doing well this am with pre procedure pain resolved.  Left calf is soft. -creatinine is 1.73 today up from 1.37 yesterday-he continues to have  good UOP.  Discontinue Metformin at this time.  IVF running at 125cc/hr.  Only received 10cc of contrast. -He will f/u with Dr. Sol Passerough next week to have his renal function checked and discuss anticoagulation as well as restarting Metformin if renal function is appropriate.  This is discussed with pt and he expressed understanding.   Pt is not on any other agent for his diabetes.  I spoke with the diabetic coordinator and given the pt does not have insurance, he will not be able to afford insulin and therefore will start Amaryl 2mg  daily.  Also discussed watching his diet carefully over the next week especially since it is a holiday.  Will defer to Dr. Sol Passerough on restarting Metformin.    -had long discussion regarding anticoagulation.  He has his starter pack and a 30 day free card and application for pt assistance for Xarelto.  He says depending on the assistance for Xarelto will depend if he will change over to coumadin.  Discussed that he will need to f/u with Dr. Sol Passerough, his PCP in [place] in the next 2-4 weeks for this discussion as he will  be following the pt's INR if he decides to change to coumadain.  Also discussed with pt that it is important to get in to see him as coumadin does not work instantly and takes a few days to become therapeutic.  He expressed understanding of this and will make appointment.   -c/o itching around bandage-discussed with pt he may take benadryl to help with this.  Doreatha MassedSamantha Rhyne, PA-C [facility] [phone] 03/11/2016 8:02 AM  I have interviewed the patient and examined the patient. I agree with the findings by the PA. Mildly elevated crt. Will follow up with his PMD. Likely related to contrast.  Will continue Xeralto for now.   Cari Carawayhris Eilee Schader, MD [phone]

## 2016-03-11 NOTE — Discharge Instructions (Signed)
Information on my medicine - XARELTO (rivaroxaban)  This medication education was reviewed with me or my healthcare representative as part of my discharge preparation.  The pharmacist that spoke with me during my hospital stay was:  Pasty Spillersobertson, Yuliana Vandrunen Stillinger, [facility]  WHY WAS Roberto HurlXARELTO PRESCRIBED FOR YOU? Xarelto was prescribed to treat blood clots that may have been found in the veins of your legs (deep vein thrombosis) or in your lungs (pulmonary embolism) and to reduce the risk of them occurring again.  What do you need to know about Xarelto? The starting dose is one 15 mg tablet taken TWICE daily with food for the FIRST 21 DAYS then on (enter date)  04/01/2016  the dose is changed to one 20 mg tablet taken ONCE A DAY with your evening meal.  DO NOT stop taking Xarelto without talking to the health care provider who prescribed the medication.  Refill your prescription for 20 mg tablets before you run out.  After discharge, you should have regular check-up appointments with your healthcare provider that is prescribing your Xarelto.  In the future your dose may need to be changed if your kidney function changes by a significant amount.  What do you do if you miss a dose? If you are taking Xarelto TWICE DAILY and you miss a dose, take it as soon as you remember. You may take two 15 mg tablets (total 30 mg) at the same time then resume your regularly scheduled 15 mg twice daily the next day.  If you are taking Xarelto ONCE DAILY and you miss a dose, take it as soon as you remember on the same day then continue your regularly scheduled once daily regimen the next day. Do not take two doses of Xarelto at the same time.   Important Safety Information Xarelto is a blood thinner medicine that can cause bleeding. You should call your healthcare provider right away if you experience any of the following: ? Bleeding from an injury or your nose that does not stop. ? Unusual colored urine (red or  dark brown) or unusual colored stools (red or black). ? Unusual bruising for unknown reasons. ? A serious fall or if you hit your head (even if there is no bleeding).  Some medicines may interact with Xarelto and might increase your risk of bleeding while on Xarelto. To help avoid this, consult your healthcare provider or pharmacist prior to using any new prescription or non-prescription medications, including herbals, vitamins, non-steroidal anti-inflammatory drugs (NSAIDs) and supplements.  This website has more information on Xarelto: [email or web address].xarelto.[email or web address].

## 2016-03-11 NOTE — Progress Notes (Signed)
03/11/2016 12:29 PM Discharge AVS meds taken today and those due this evening reviewed.  Follow-up appointments and when to call md reviewed.  D/C IV and TELE.  Questions and concerns addressed.   D/C home per orders. Kathryne HitchAllen, Trinh Sanjose C

## 2016-03-11 NOTE — Progress Notes (Signed)
Spoke at length with patient regarding diabetes, A1C, and medications.  He states that he has not been taking his metformin for the past 3-4 months.  He recently reestablished care with Dr. Sol Passerough and now plans to take his medications.  Explained amaryl and how it works to patient.  Discussed goal blood sugar less than 180 mg/dL at this time.  He states he has been trying to eat better and limit CHO.  He drinks water as beverage and avoids sweetened drinks.  Explained that he may need insulin based on A1C results.  He plans to follow-up with Dr. Sol Passerough next Wednesday and discuss diabetes management further.  Patient was able to teach back information given regarding follow-up, hyperglycemia, hypoglycemia, etc.  He states he has Living well with diabetes booklet as well.   Thanks, Beryl MeagerJenny Joycelynn Fritsche, RN, BC-ADM Inpatient Diabetes Coordinator Pager [phone] (8a-5p)

## 2016-03-11 NOTE — Discharge Summary (Addendum)
Discharge Summary    Roberto Sosa 1960/06/05 55 y.o. male  [id number]  Admission Date: 03/07/2016  Discharge Date: 03/11/16  Physician: Sherren Kerns, MD  Admission Diagnosis: DVT dvt of LLE PVD   HPI:   This is a 55 y.o. male with history of fall at work on left leg 11/17.  He saw Dr Fayrene Fearing from orthopedics on 12/1 ankle and foot xray essentially negative.  He had an MRI of foot as well but this is not available today.  He developed swelling in left leg after that appt.  He was seen in the ER last pm and got a xarelto starter pack last night.  At his [place] today in vasc lab he had extensive DVT entire left leg.  Leg is painful and heavy but motor sensation intact.  He denies hemoptysis or dyspnea.  Chronic medical problems include CAD, DM, elevated cholesterol all of which are stable.  He denies family history of DVT but is adopted.  He has no prior history of head bleed or there bleeding complications.  Hospital Course:  The patient was admitted to the hospital and taken to the [facility] lab on 03/09/2016 and underwent: 1.  Left popliteal vein cannulation under ultrasound guidance 2.  Placement of catheter in left femoropopliteal vein and inferior vena cava  3.  Intravascular ultrasound of left femoropopliteal vein, external iliac vein and inferior vena cava  4.  Left leg ascending venography 5.  Inferior vena cavagram 6.  Rheolytic thrombectomy of left femoropopliteal and external iliac veins 7.  Placement of thrombolytic catheter into left femoropopliteal and external iliac vein 8.  Conscious sedation for 75 minutes  He was transferred to the ICU.  Later that evening, pt stated his left leg was feeling better and was softer with decreased turgor.     On 03/10/2016 he was taken back to the [facility] lab and underwent: Left lower extremity venogram, left common and external iliac venous stent, mechanical thrombectomy left popliteal and superficial femoral vein, venous follow-up study,  ultrasound of left groin, intravascular ultrasound of left popliteal superficial femoral external and common iliac vein.   By the next morning, pt's pain continued to be improved and calf is soft.  He did have a bump in his creatinine from 1.37 to 1.73.  His metformin and Naproxen are discontinued.     He will f/u with Dr. Sol Passer next week to have his renal function checked and discuss anticoagulation as well as restarting Metformin if renal function is appropriate.  This is discussed with pt and he expressed understanding.   Pt is not on any other agent for his diabetes.  I spoke with the diabetic coordinator and given the pt does not have insurance, he will not be able to afford insulin and therefore will start Amaryl 2mg  daily.  Also discussed watching his diet carefully over the next week especially since it is a holiday.  Will defer to Dr. Sol Passer on restarting Metformin.   The remainder of the hospital course consisted of increasing mobilization and increasing intake of solids without difficulty.  CBC    Component Value Date/Time   WBC 9.8 03/11/2016 0340   RBC 4.67 03/11/2016 0340   HGB 13.3 03/11/2016 0340   HCT 38.7 (L) 03/11/2016 0340   PLT 146 (L) 03/11/2016 0340   MCV 82.9 03/11/2016 0340   MCH 28.5 03/11/2016 0340   MCHC 34.4 03/11/2016 0340   RDW 13.3 03/11/2016 0340   LYMPHSABS 2.7 03/06/2016 1742   MONOABS  0.5 03/06/2016 1742   EOSABS 0.3 03/06/2016 1742   BASOSABS 0.0 03/06/2016 1742    BMET    Component Value Date/Time   NA 139 03/11/2016 0340   K 3.6 03/11/2016 0340   CL 106 03/11/2016 0340   CO2 21 (L) 03/11/2016 0340   GLUCOSE 267 (H) 03/11/2016 0340   BUN 13 03/11/2016 0340   CREATININE 1.73 (H) 03/11/2016 0340   CALCIUM 8.3 (L) 03/11/2016 0340   GFRNONAA 43 (L) 03/11/2016 0340   GFRAA 49 (L) 03/11/2016 0340      Discharge Instructions    Call MD for:  redness, tenderness, or signs of infection (pain, swelling, bleeding, redness, odor or green/yellow  discharge around incision site)    Complete by:  As directed    Call MD for:  severe or increased pain, loss or decreased feeling  in affected limb(s)    Complete by:  As directed    Call MD for:  temperature >100.5    Complete by:  As directed    Discharge instructions    Complete by:  As directed    Stop taking Metformin & Naproxen until further notice.  Make appointment to see Dr. Sol Passerough next week to have kidney function checked, discussed blood thinners and check diabetes medication.   Discharge wound care:    Complete by:  As directed    Shower daily with soap and water starting 03/11/16   Driving Restrictions    Complete by:  As directed    No driving for 24 hours and while taking pain medication.   Lifting restrictions    Complete by:  As directed    No lifting for 2 weeks   Resume previous diet    Complete by:  As directed       Discharge Diagnosis:  DVT dvt of LLE PVD  Secondary Diagnosis: Patient Active Problem List   Diagnosis Date Noted  . DVT (deep venous thrombosis) (HCC) 03/07/2016  . Left thigh pain 03/03/2016   Past Medical History:  Diagnosis Date  . Coronary artery disease   . Diabetes mellitus without complication (HCC)   . High cholesterol      Allergies as of 03/11/2016   No Known Allergies     Medication List    STOP taking these medications   metFORMIN 1000 MG tablet Commonly known as:  GLUCOPHAGE   naproxen 500 MG tablet Commonly known as:  NAPROSYN     TAKE these medications   aspirin EC 81 MG tablet Take 81 mg by mouth daily.   atorvastatin 80 MG tablet Commonly known as:  LIPITOR Take 80 mg by mouth daily at 6 PM.   carvedilol 12.5 MG tablet Commonly known as:  COREG Take 12.5 mg by mouth 2 (two) times daily with a meal.   gabapentin 600 MG tablet Commonly known as:  NEURONTIN Take 600 mg by mouth 3 (three) times daily.   glimepiride 2 MG tablet Commonly known as:  AMARYL Take 1 tablet (2 mg total) by mouth daily  with breakfast.   methocarbamol 500 MG tablet Commonly known as:  ROBAXIN Take 1 tablet (500 mg total) by mouth every 6 (six) hours as needed for muscle spasms.   nitroGLYCERIN 0.4 MG SL tablet Commonly known as:  NITROSTAT Place 0.4 mg under the tongue.   oxyCODONE-acetaminophen 5-325 MG tablet Commonly known as:  PERCOCET/ROXICET Take 1 tablet by mouth every 6 (six) hours as needed for moderate pain.   XARELTO PO Take by  mouth.   Rivaroxaban 15 & 20 MG Tbpk Take as directed on package: Start with one 15mg  tablet by mouth twice a day with food. On Day 22, switch to one 20mg  tablet once a day with food.       Prescriptions given: 1. Roxicet #8 No Refill 2.  Amaryl 2mg  daily starting 03/12/16 #30 NR (refills per PCP)  Instructions: 1.  No driving for 24 hours and while taking pain medication 2.  No heavy lifting x 2 weeks 3.  Shower daily starting 03/11/16 4.  Stop taking Metformin/Naproxen until renal function is checked next week  Disposition: home  Patient's condition: is Good  Follow up: 1. Dr. Darrick PennaFields in 3-4 weeks with venous duplex 2. Dr. Sol Passerough in 1 week with check of renal function, discuss anticoagulation and discuss restarting metformin.  Amaryl 2mg  daily given - refills per Dr. Carrington Clampough   Shena Vinluan, PA-C [facility] [phone] 03/11/2016  9:57 AM

## 2016-03-14 ENCOUNTER — Telehealth: Payer: Self-pay | Admitting: Vascular Surgery

## 2016-03-14 ENCOUNTER — Encounter (HOSPITAL_COMMUNITY): Payer: Self-pay | Admitting: Vascular Surgery

## 2016-03-14 ENCOUNTER — Other Ambulatory Visit: Payer: Self-pay | Admitting: *Deleted

## 2016-03-14 DIAGNOSIS — N200 Calculus of kidney: Secondary | ICD-10-CM | POA: Insufficient documentation

## 2016-03-14 DIAGNOSIS — I82422 Acute embolism and thrombosis of left iliac vein: Secondary | ICD-10-CM

## 2016-03-14 DIAGNOSIS — Z9862 Peripheral vascular angioplasty status: Secondary | ICD-10-CM

## 2016-03-14 HISTORY — DX: Calculus of kidney: N20.0

## 2016-03-14 NOTE — Telephone Encounter (Signed)
-----   Message from Sharee PimpleMarilyn K McChesney, RN sent at 03/14/2016 10:02 AM EST ----- Regarding: Iliac venous duplex w/appt 3-4 weeks   ----- Message ----- From: Dara LordsSamantha J Rhyne, PA-C Sent: 03/11/2016   9:11 AM To: V[facility] Charge Pool  S/p Left lower extremity venogram, left common and external iliac venous stent, mechanical thrombectomy left popliteal and superficial femoral vein, venous follow-up study, ultrasound of left groin, intravascular ultrasound of left popliteal superficial femoral external and common iliac vein.  F/u in 3-4 weeks with venous duplex left leg.  Thanks, Lelon MastSamantha

## 2016-03-14 NOTE — Telephone Encounter (Signed)
Sched lab 04/03/16 at 3:30. Sched MD 04/06/16 at 11:45. Spoke to pt to inform them of appt.

## 2016-03-17 ENCOUNTER — Telehealth (INDEPENDENT_AMBULATORY_CARE_PROVIDER_SITE_OTHER): Payer: Self-pay | Admitting: Orthopaedic Surgery

## 2016-03-17 NOTE — Telephone Encounter (Signed)
French Anaracy (nurse case manager with [employer] called needing notes faxed to her for all DOS with Dr Roda ShuttersXu. The phone # is [phone]  The fax# is [phone]

## 2016-03-22 ENCOUNTER — Encounter: Payer: Self-pay | Admitting: Vascular Surgery

## 2016-03-22 NOTE — Telephone Encounter (Signed)
Faxed notes to  [phone]

## 2016-03-28 ENCOUNTER — Encounter: Payer: Self-pay | Admitting: *Deleted

## 2016-04-03 ENCOUNTER — Ambulatory Visit (HOSPITAL_COMMUNITY): Payer: Self-pay

## 2016-04-04 ENCOUNTER — Ambulatory Visit: Payer: Self-pay | Admitting: Vascular Surgery

## 2016-04-06 ENCOUNTER — Encounter: Payer: Self-pay | Admitting: Vascular Surgery

## 2016-04-06 ENCOUNTER — Ambulatory Visit: Payer: Self-pay | Admitting: Vascular Surgery

## 2016-04-07 ENCOUNTER — Encounter: Payer: Self-pay | Admitting: Vascular Surgery

## 2016-04-07 ENCOUNTER — Other Ambulatory Visit: Payer: Self-pay

## 2016-04-07 DIAGNOSIS — I82409 Acute embolism and thrombosis of unspecified deep veins of unspecified lower extremity: Secondary | ICD-10-CM

## 2016-04-07 MED ORDER — RIVAROXABAN 20 MG PO TABS: 20.0000 mg | ORAL_TABLET | Freq: Every day | ORAL | 3 refills | Status: DC

## 2016-04-10 ENCOUNTER — Encounter (INDEPENDENT_AMBULATORY_CARE_PROVIDER_SITE_OTHER): Payer: Self-pay | Admitting: Orthopaedic Surgery

## 2016-04-10 ENCOUNTER — Ambulatory Visit (HOSPITAL_COMMUNITY)
Admission: RE | Admit: 2016-04-10 | Discharge: 2016-04-10 | Disposition: A | Discharge status: Home / Self Care | Payer: Worker's Compensation | Source: Ambulatory Visit | Attending: Vascular Surgery | Admitting: Vascular Surgery

## 2016-04-10 ENCOUNTER — Ambulatory Visit (INDEPENDENT_AMBULATORY_CARE_PROVIDER_SITE_OTHER): Payer: Worker's Compensation | Admitting: Orthopaedic Surgery

## 2016-04-10 DIAGNOSIS — Z9862 Peripheral vascular angioplasty status: Secondary | ICD-10-CM | POA: Diagnosis not present

## 2016-04-10 DIAGNOSIS — I82422 Acute embolism and thrombosis of left iliac vein: Secondary | ICD-10-CM | POA: Insufficient documentation

## 2016-04-10 DIAGNOSIS — M79672 Pain in left foot: Secondary | ICD-10-CM

## 2016-04-10 NOTE — Progress Notes (Signed)
Office Visit Note   Patient: Roberto HickJeff Sosa           Date of Birth: [birth year]           MRN: [id number] Visit Date: 04/10/2016              Requested by: Olive Bassobert L Dough, MD [address] [place], [place] [address] PCP: Dina RichUGH,ROBERT, MD   Assessment & Plan: Visit Diagnoses:  1. Left foot pain     Plan: MRI shows mainly chronic degenerative changes with contusion of the medial cuneiform.  Patient seems to be more symptomatic from the generalized edema and swelling. I recommend TED hose and elevation. He is currently on xarelto for DVT treatment. From orthopedic standpoint patient should continue to improve. He is already back to doing his normal duty. He can continue to do so. I'll see him back as needed. Questions encouraged and answered.  Follow-Up Instructions: Return if symptoms worsen or fail to improve.   Orders:  No orders of the defined types were placed in this encounter.  No orders of the defined types were placed in this encounter.     Procedures: No procedures performed   Clinical Data: No additional findings.   Subjective: Chief Complaint  Patient presents with  . Left Foot - Follow-up    Patient comes back today to review his MRI of his left foot. In the meantime he did develop a large DVT in his left lower extremity which required stenting. He states that the vascular surgeon told him that his DVT was likely caused by his fall which kinked his vessels. He is complaining of swelling and pain in his left foot.    Review of Systems   Objective: Vital Signs: There were no vitals taken for this visit.  Physical Exam  Ortho Exam Exam of the left lower extremity shows severe pitting edema of the foot and lower extremity. The foot is overall warm and well-perfused. He is not significantly tender over his foot. Specialty Comments:  No specialty comments available.  Imaging: No results found.   PMFS History: Patient Active Problem List   Diagnosis  Date Noted  . DVT (deep venous thrombosis) (HCC) 03/07/2016  . Left thigh pain 03/03/2016   Past Medical History:  Diagnosis Date  . Coronary artery disease   . Diabetes mellitus without complication (HCC)   . High cholesterol     Family History  Problem Relation Age of Onset  . Adopted: Yes    Past Surgical History:  Procedure Laterality Date  . CARDIAC CATHETERIZATION Left 03/09/2016   Procedure: Intravascular Ultrasound/IVUS;  Surgeon: Fransisco HertzBrian L Chen, MD;  [facility];  Service: Cardiovascular;  Laterality: Left;  IVC-LT COMMON ILIAC VEIN-LT EXR ILIAC VEIN-LT DEEP FEMORAL/POPLITEAL VEIN LYSIS AND THROMBECTOMY  . CARDIAC CATHETERIZATION Left 03/10/2016   Procedure: Intravascular Ultrasound/IVUS;  Surgeon: Sherren Kernsharles E Fields, MD;  [facility];  Service: Cardiovascular;  Laterality: Left;  Lower extremity venous.  . ORTHOPEDIC SURGERY Bilateral 06/2006   multiple fractures and breaks  . PERIPHERAL VASCULAR CATHETERIZATION Left 03/09/2016   Procedure: Lower Extremity Venography;  Surgeon: Fransisco HertzBrian L Chen, MD;  [facility];  Service: Cardiovascular;  Laterality: Left;  . PERIPHERAL VASCULAR CATHETERIZATION N/A 03/09/2016   Procedure: IVC Venography;  Surgeon: Fransisco HertzBrian L Chen, MD;  [facility];  Service: Cardiovascular;  Laterality: N/A;  . PERIPHERAL VASCULAR CATHETERIZATION Left 03/10/2016   Procedure: Lower Extremity Venography;  Surgeon: Leonette Mostharles  Holland Commons, MD;  Location: [facility] INVAS[facility] CV LAB;  Service: Cardiovascular;  Laterality: Left;  . PERIPHERAL VASCULAR CATHETERIZATION Left 03/10/2016   Procedure: Peripheral Vascular Intervention;  Surgeon: Sherren Kerns, MD;  [facility] CV LAB;  Service: Cardiovascular;  Laterality: Left;  Left common and external iliac vein.     Social History   Occupational History  . 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 Smoking status: Former  Smoker    Packs/day: 2.00    Years: 20.00    Types: Cigarettes, Pipe, Cigars    Quit date: 2001  . Smokeless tobacco: Current User    Types: Chew  . Alcohol use Yes     Comment: 2 beers every 2 months  . Drug use: No  . Sexual activity: Not Currently

## 2016-04-13 ENCOUNTER — Telehealth: Payer: Self-pay

## 2016-04-13 ENCOUNTER — Ambulatory Visit (INDEPENDENT_AMBULATORY_CARE_PROVIDER_SITE_OTHER): Payer: Worker's Compensation | Admitting: Vascular Surgery

## 2016-04-13 ENCOUNTER — Encounter: Payer: Self-pay | Admitting: Vascular Surgery

## 2016-04-13 VITALS — BP 134/78 | HR 73 | Temp 99.1°F | Resp 18 | Ht 73.0 in | Wt 270.2 lb

## 2016-04-13 DIAGNOSIS — I824Y2 Acute embolism and thrombosis of unspecified deep veins of left proximal lower extremity: Secondary | ICD-10-CM | POA: Diagnosis not present

## 2016-04-13 DIAGNOSIS — I82409 Acute embolism and thrombosis of unspecified deep veins of unspecified lower extremity: Secondary | ICD-10-CM | POA: Diagnosis not present

## 2016-04-13 MED ORDER — RIVAROXABAN 20 MG PO TABS: 20.0000 mg | ORAL_TABLET | Freq: Every day | ORAL | 6 refills | Status: DC

## 2016-04-13 NOTE — Telephone Encounter (Signed)
-----   Message from Sharee PimpleMarilyn K McChesney, RN sent at 04/12/2016 12:53 PM EST ----- Regarding: FW: Workmen's Comp won't pay for [employer]ELTO   ----- Message ----- From: Sherren Kernsharles E Fields, MD Sent: 04/12/2016  12:21 PM To: Sharee PimpleMarilyn K McChesney, RN Subject: RE: Workmen's Comp won't pay for Carlena HurlXARELTO       We can't say that.  He has a DVT but I can't say work caused it.  He can switch to coumadin which is cheaper but I am not going to say that.  Fabienne Brunsharles Fields ----- Message ----- From: Sharee PimpleMarilyn K McChesney, RN Sent: 04/11/2016   9:32 AM To: Sherren Kernsharles E Fields, MD Subject: Workmen's Comp won't pay for XARELTO           [facility] because notes don't say that his DVT is directly related to his injury in November. He is out of meds today and can't afford to pay for them himself. [facility] needed and DVT related to WC. What do you want me to say in the letter?    Joyce GrossKay

## 2016-04-13 NOTE — Progress Notes (Signed)
Patient is a 56 year old male who returns for follow-up today. He underwent stenting of his left common and external iliac vein after extensive DVT. He currently is taking Xarelto. His DVT occurred around the time of an injury to his left leg at work. He states the swelling in his left leg is significantly improved although there is some trace swelling currently.  Review of systems: He denies shortness of breath. He denies chest pain. He denies hemoptysis. He has had no problems with bleeding from his Xarelto.  Physical exam:  Vitals:   04/13/16 1148 04/13/16 1150  BP: (!) 154/81 134/78  Pulse: 73   Resp: 18   Temp: 99.1 F (37.3 C)   TempSrc: Oral   SpO2: 98%   Weight: 270 lb 3.2 oz (122.6 kg)   Height: 6\' 1"  (1.854 m)     Extremities: 2+ dorsalis pedis pulses bilaterally. Trace edema left lower extremity approximately 5% larger than the right lower extremity  Data: Patient had a recent duplex exam which showed widely patent left common iliac vein stent  Assessment: Doing well status post left iliac venous stenting for DVT  Plan: Continue to Xarelto unless cost becomes an issue then switch to Coumadin. Plan initially will be to continue his anticoagulation for at least 6 months and then reassess. The patient will follow-up with our nurse practitioner in 6 months time with a repeat duplex at that point.  Fabienne Brunsharles Fields, MD [facility] Office: [phone] Pager: [phone]

## 2016-04-13 NOTE — Telephone Encounter (Signed)
Pt. had f/u with Dr. Fields today.Darrick Penna  See progress note from Dr. Darrick PennaFields for plan.

## 2016-04-17 ENCOUNTER — Other Ambulatory Visit: Payer: Self-pay

## 2016-04-17 NOTE — Addendum Note (Signed)
Addended by: Burton ApleyPETTY, Jacquis Paxton A on: 04/17/2016 12:23 PM   Modules accepted: Orders

## 2016-04-17 NOTE — Addendum Note (Signed)
Addended by: Burton ApleyPETTY, Finnleigh Marchetti A on: 04/17/2016 03:51 PM   Modules accepted: Orders

## 2016-05-16 ENCOUNTER — Encounter: Payer: Self-pay | Admitting: Vascular Surgery

## 2016-08-29 ENCOUNTER — Ambulatory Visit (INDEPENDENT_AMBULATORY_CARE_PROVIDER_SITE_OTHER): Payer: Worker's Compensation | Admitting: Orthopaedic Surgery

## 2016-08-29 DIAGNOSIS — M79672 Pain in left foot: Secondary | ICD-10-CM

## 2016-08-29 NOTE — Progress Notes (Signed)
Office Visit Note   Patient: Jannifer HickJeff Beigel           Date of Birth: 04-Jul-1960           MRN: [id number] Visit Date: 08/29/2016              Requested by: Olive Bassough, Robert L, MD [address] [place], [place] [id number] PCP: Olive Bassough, Robert L, MD   Assessment & Plan: Visit Diagnoses:  1. Left foot pain     Plan: Patient has degenerative midfoot. I did offer her prescription for custom orthotic [employer]. He declined x-rays. We did have a previous MRI which shows degenerative changes of the midfoot. Follow-up with me as needed. Questions encouraged and answered.  Follow-Up Instructions: Return if symptoms worsen or fail to improve.   Orders:  No orders of the defined types were placed in this encounter.  No orders of the defined types were placed in this encounter.     Procedures: No procedures performed   Clinical Data: No additional findings.   Subjective: Chief Complaint  Patient presents with  . Left Ankle - Pain  . Left Foot - Pain    Patient follows up today for her left foot pain. He states that he is overall feeling better than he was in January. He has worn special Dr. Margart SicklesScholl's shoes and they have not helped. He does a lot of standing and walking and climbing stairs at his job. He is currently also relative for DVT.    Review of Systems  Constitutional: Negative.   All other systems reviewed and are negative.    Objective: Vital Signs: There were no vitals taken for this visit.  Physical Exam  Constitutional: He is oriented to person, place, and time. He appears well-developed and well-nourished.  Pulmonary/Chest: Effort normal.  Abdominal: Soft.  Neurological: He is alert and oriented to person, place, and time.  Skin: Skin is warm.  Psychiatric: He has a normal mood and affect. His behavior is normal. Judgment and thought content normal.  Nursing note and vitals reviewed.   Ortho Exam Left foot exam shows midfoot tenderness to palpation. There is no  swelling or evidence of infection. There is no redness or warmth. Specialty Comments:  No specialty comments available.  Imaging: No results found.   PMFS History: Patient Active Problem List   Diagnosis Date Noted  . DVT (deep venous thrombosis) (HCC) 03/07/2016  . Left thigh pain 03/03/2016   Past Medical History:  Diagnosis Date  . Coronary artery disease   . Diabetes mellitus without complication (HCC)   . High cholesterol     Family History  Problem Relation Age of Onset  . Adopted: Yes    Past Surgical History:  Procedure Laterality Date  . CARDIAC CATHETERIZATION Left 03/09/2016   Procedure: Intravascular Ultrasound/IVUS;  Surgeon: Fransisco HertzBrian L Chen, MD;  [facility];  Service: Cardiovascular;  Laterality: Left;  IVC-LT COMMON ILIAC VEIN-LT EXR ILIAC VEIN-LT DEEP FEMORAL/POPLITEAL VEIN LYSIS AND THROMBECTOMY  . CARDIAC CATHETERIZATION Left 03/10/2016   Procedure: Intravascular Ultrasound/IVUS;  Surgeon: Sherren Kernsharles E Fields, MD;  [facility];  Service: Cardiovascular;  Laterality: Left;  Lower extremity venous.  . ORTHOPEDIC SURGERY Bilateral 06/2006   multiple fractures and breaks  . PERIPHERAL VASCULAR CATHETERIZATION Left 03/09/2016   Procedure: Lower Extremity Venography;  Surgeon: Fransisco HertzBrian L Chen, MD;  [facility];  Service: Cardiovascular;  Laterality: Left;  . PERIPHERAL VASCULAR CATHETERIZATION N/A 03/09/2016   Procedure:  IVC Venography;  Surgeon: Fransisco Hertz, MD;  Location: [facility] INVASIVE CV LAB;  Service: Cardiovascular;  Laterality: N/A;  . PERIPHERAL VASCULAR CATHETERIZATION Left 03/10/2016   Procedure: Lower Extremity Venography;  Surgeon: Sherren Kerns, MD;  Location[facility];  Service: Cardiovascular;  Laterality: Left;  . PERIPHERAL VASCULAR CATHETERIZATION Left 03/10/2016   Procedure: Peripheral Vascular Intervention;  Surgeon: Sherren Kerns, MD;  Location: [facility] INVASIVE CV LAB;  Service:  Cardiovascular;  Laterality: Left;  Left common and external iliac vein.     Social History   Occupational History  . 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 Smoking status: Former Smoker    Packs/day: 2.00    Years: 20.00    Types: Cigarettes, Pipe, Cigars    Quit date: 2001  . Smokeless tobacco: Current User    Types: Chew  . Alcohol use Yes     Comment: 2 beers every 2 months  . Drug use: No  . Sexual activity: Not Currently

## 2016-09-14 ENCOUNTER — Telehealth (INDEPENDENT_AMBULATORY_CARE_PROVIDER_SITE_OTHER): Payer: Self-pay

## 2016-09-14 NOTE — Telephone Encounter (Signed)
Faxed the 08/29/16 office note to [facility] per her request

## 2016-10-06 ENCOUNTER — Encounter: Payer: Self-pay | Admitting: Family

## 2016-10-12 ENCOUNTER — Ambulatory Visit (INDEPENDENT_AMBULATORY_CARE_PROVIDER_SITE_OTHER): Payer: Worker's Compensation | Admitting: Family

## 2016-10-12 ENCOUNTER — Encounter: Payer: Self-pay | Admitting: Family

## 2016-10-12 ENCOUNTER — Ambulatory Visit (HOSPITAL_COMMUNITY)
Admission: RE | Admit: 2016-10-12 | Discharge: 2016-10-12 | Disposition: A | Discharge status: Home / Self Care | Payer: Worker's Compensation | Source: Ambulatory Visit | Attending: Family | Admitting: Family

## 2016-10-12 VITALS — BP 140/80 | HR 72 | Temp 97.5°F | Resp 18 | Ht 73.0 in | Wt 268.0 lb

## 2016-10-12 DIAGNOSIS — Z86718 Personal history of other venous thrombosis and embolism: Secondary | ICD-10-CM | POA: Diagnosis not present

## 2016-10-12 DIAGNOSIS — Z7901 Long term (current) use of anticoagulants: Secondary | ICD-10-CM | POA: Insufficient documentation

## 2016-10-12 DIAGNOSIS — Z87891 Personal history of nicotine dependence: Secondary | ICD-10-CM | POA: Insufficient documentation

## 2016-10-12 DIAGNOSIS — I82409 Acute embolism and thrombosis of unspecified deep veins of unspecified lower extremity: Secondary | ICD-10-CM | POA: Diagnosis not present

## 2016-10-12 DIAGNOSIS — I824Y2 Acute embolism and thrombosis of unspecified deep veins of left proximal lower extremity: Secondary | ICD-10-CM | POA: Diagnosis not present

## 2016-10-12 NOTE — Patient Instructions (Addendum)
What You Need to Know About Smokeless Tobacco Use Tobacco use is one of the leading causes of cancer and other chronic health problems. Smokeless tobacco is tobacco that is put directly into the mouth instead of being smoked. It may also be called chewing tobacco or snuff. Smokeless tobacco is made from the leaves of tobacco plants and it comes in several forms:  Loose, dry leaves, plugs, or twists.  Moist pouches.  Dissolving lozenges or strips.  Chewing, sucking, or holding the tobacco in your mouth causes your mouth to make more saliva. The saliva mixes with the tobacco to make "tobacco juice" that is swallowed or spit out. How can smokeless tobacco affect me? Using smokeless tobacco:  Increases your risk of developing cancer. Smokeless tobacco contains at least 28 different types of cancer-causing chemicals (carcinogens).  Increases your chances of developing other long-term health problems, including high blood pressure, heart disease, stroke, and dental problems.  Can make you become addicted. Nicotine is one of the chemicals in tobacco. When you chew tobacco, you absorb nicotine from the tobacco juice. This can make you feel more alert than usual.  Can cause problems with pregnancy. Pregnant women who use smokeless tobacco are more likely to miscarry or deliver a baby too early (premature delivery).  Can affect the appearance and health of your mouth. Using smokeless tobacco may cause bad breath, yellow-brown teeth, mouth sores, cracking and bleeding lips, gum recession, and lesions on the soft tissues of your mouth (leukoplakia).  What are the benefits of not using smokeless tobacco? The benefits of not using smokeless tobacco include:  A healthy mind because: ? You avoid addiction.  A healthy body because: ? You avoid dental problems. ? You promote healthy pregnancy. ? You avoid long-term health problems.  A healthy wallet because: ? You avoid costs of buying  tobacco. ? You avoid health care costs in the future.  A healthy family because: ? You avoid accidental poisoning of children in your household.  What can happen if I continue to use smokeless tobacco? If you continue to use smokeless tobacco, you will increase your risk for developing certain cancers. These include:  Tongue.  Lips, mouth, and gums.  Throat (esophagus) and voice box (larynx).  Stomach.  Pancreas.  Bladder.  Colon.  Long-term use of smokeless tobacco can also lead to:  High blood pressure, heart disease, and stroke.  Gum disease, gum recession, and bone loss around the teeth.  Tooth decay.  How do I quit using smokeless tobacco? Quitting the use of smokeless tobacco can be hard, but it can be done. Follow these steps:  Pick a date to quit. Set a date within the next two weeks. This gives you time to prepare.  Write down the reasons why you are quitting. Keep this list in places where you will see it often, such as on your bathroom mirror or in your car or wallet.  Identify the people, places, things, and activities that make you want to use tobacco (triggers) and avoid them.  Get rid of any tobacco you have and remove any tobacco smells. To do this: ? Throw away all containers of tobacco at home, at work, and in your car. ? Throw away any other items that you use regularly when you chew tobacco. ? Clean your car and make sure to remove all tobacco-related items. ? Clean your home, including curtains and carpets.  Tell your family, friends, and coworkers that you are quitting. This can make quitting   easier.  Ask your health care provider for help quitting smokeless tobacco. This may involve treatment. Find out what treatment options are covered by your health insurance.  Keep track of how many days have passed since you quit. Remembering how long and hard you have worked to quit can help you avoid using tobacco again.  Where can I get support? Ask  your health care provider if there is a local support group for quitting smokeless tobacco. Where can I get more information? You can learn more about the risks of using smokeless tobacco and the benefits of quitting from these sources:  National Cancer Institute: www.cancer.gov  American Cancer Society: www.cancer.org  When should I seek medical care? Seek medical care if you have:  White or other discolored patches in your mouth.  Difficulty swallowing.  A change in your voice.  Unexplained weight loss.  Stomach pain, nausea, or vomiting.  Summary  Smokeless tobacco contains at least 28 different chemicals that are known to cause cancer (carcinogen).  Nicotine is an addictive chemical in smokeless tobacco.  When you quit using smokeless tobacco, you lower your risk of developing cancer. This information is not intended to replace advice given to you by your health care provider. Make sure you discuss any questions you have with your health care provider. Document Released: 08/08/2010 Document Revised: 10/30/2015 Document Reviewed: 10/16/2014 Elsevier Interactive Patient Education  2017 Elsevier Inc.    Deep Vein Thrombosis A deep vein thrombosis (DVT) is a blood clot (thrombus) that usually occurs in a deep, larger vein of the lower leg or the pelvis, or in an upper extremity such as the arm. These are dangerous and can lead to serious and even life-threatening complications if the clot travels to the lungs. A DVT can damage the valves in your leg veins so that instead of flowing upward, the blood pools in the lower leg. This is called post-thrombotic syndrome, and it can result in pain, swelling, discoloration, and sores on the leg. What are the causes? A DVT is caused by the formation of a blood clot in your leg, pelvis, or arm. Usually, several things contribute to the formation of blood clots. A clot may develop when:  Your blood flow slows down.  Your vein becomes  damaged in some way.  You have a condition that makes your blood clot more easily.  What increases the risk? A DVT is more likely to develop in:  People who are older, especially over 56 years of age.  People who are overweight (obese).  People who sit or lie still for a long time, such as during long-distance travel (over 4 hours), bed rest, hospitalization, or during recovery from certain medical conditions like a stroke.  People who do not engage in much physical activity (sedentary lifestyle).  People who have chronic breathing disorders.  People who have a personal or family history of blood clots or blood clotting disease.  People who have peripheral vascular disease (PVD), diabetes, or some types of cancer.  People who have heart disease, especially if the person had a recent heart attack or has congestive heart failure.  People who have neurological diseases that affect the legs (leg paresis).  People who have had a traumatic injury, such as breaking a hip or leg.  People who have recently had major or lengthy surgery, especially on the hip, knee, or abdomen.  People who have had a central line placed inside a large vein.  People who take medicines that contain  the hormone estrogen. These include birth control pills and hormone replacement therapy.  Pregnancy or during childbirth or the postpartum period.  Long plane flights (over 8 hours).  What are the signs or symptoms?  Symptoms of a DVT can include:  Swelling of your leg or arm, especially if one side is much worse.  Warmth and redness of your leg or arm, especially if one side is much worse.  Pain in your arm or leg. If the clot is in your leg, symptoms may be more noticeable or worse when you stand or walk.  A feeling of pins and needles, if the clot is in the arm.  The symptoms of a DVT that has traveled to the lungs (pulmonary embolism, PE) usually start suddenly and include:  Shortness of breath  while active or at rest.  Coughing or coughing up blood or blood-tinged mucus.  Chest pain that is often worse with deep breaths.  Rapid or irregular heartbeat.  Feeling light-headed or dizzy.  Fainting.  Feeling anxious.  Sweating.  There may also be pain and swelling in a leg if that is where the blood clot started. These symptoms may represent a serious problem that is an emergency. Do not wait to see if the symptoms will go away. Get medical help right away. Call your local emergency services (911 in the [place] Do not drive yourself to the hospital. How is this diagnosed? Your health care provider will take a medical history and perform a physical exam. You may also have other tests, including:  Blood tests to assess the clotting properties of your blood.  Imaging tests, such as CT, ultrasound, MRI, X-ray, and other tests to see if you have clots anywhere in your body.  How is this treated? After a DVT is identified, it can be treated. The type of treatment that you receive depends on many factors, such as the cause of your DVT, your risk for bleeding or developing more clots, and other medical conditions that you have. Sometimes, a combination of treatments is necessary. Treatment options may be combined and include:  Monitoring the blood clot with ultrasound.  Taking medicines by mouth, such as newer blood thinners (anticoagulants), thrombolytics, or warfarin.  Taking anticoagulant medicine by injection or through an IV tube.  Wearing compression stockings or using different types ofdevices.  Surgery (rare) to remove the blood clot or to place a filter in your abdomen to stop the blood clot from traveling to your lungs.  Treatments for a DVT are often divided into immediate treatment and long-term treatment (up to 3 months after DVT). You can work with your health care provider to choose the treatment program that is best for you. Follow these instructions at home: If  you are taking a newer oral anticoagulant:  Take the medicine every single day at the same time each day.  Understand what foods and drugs interact with this medicine.  Understand that there are no regular blood tests required when using this medicine.  Understand the side effects of this medicine, including excessive bruising or bleeding. Ask your health care provider or pharmacist about other possible side effects. If you are taking warfarin:  Understand how to take warfarin and know which foods can affect how warfarin works in Public relations account executive.  Understand that it is dangerous to take too much or too little warfarin. Too much warfarin increases the risk of bleeding. Too little warfarin continues to allow the risk for blood clots.  Follow your PT  and INR blood testing schedule. The PT and INR results allow your health care provider to adjust your dose of warfarin. It is very important that you have your PT and INR tested as often as told by your health care provider.  Avoid major changes in your diet, or tell your health care provider before you change your diet. Arrange a visit with a registered dietitian to answer your questions. Many foods, especially foods that are high in vitamin K, can interfere with warfarin and affect the PT and INR results. Eat a consistent amount of foods that are high in vitamin K, such as: ? Spinach, kale, broccoli, cabbage, collard greens, turnip greens, Brussels sprouts, peas, cauliflower, seaweed, and parsley. ? Beef liver and pork liver. ? Green tea. ? Soybean oil.  Tell your health care provider about any and all medicines, vitamins, and supplements that you take, including aspirin and other over-the-counter anti-inflammatory medicines. Be especially cautious with aspirin and anti-inflammatory medicines. Do not take those before you ask your health care provider if it is safe to do so. This is important because many medicines can interfere with warfarin and affect  the PT and INR results.  Do not start or stop taking any over-the-counter or prescription medicine unless your health care provider or pharmacist tells you to do so. If you take warfarin, you will also need to do these things:  Hold pressure over cuts for longer than usual.  Tell your dentist and other health care providers that you are taking warfarin before you have any procedures in which bleeding may occur.  Avoid alcohol or drink very small amounts. Tell your health care provider if you change your alcohol intake.  Do not use tobacco products, including cigarettes, chewing tobacco, and e-cigarettes. If you need help quitting, ask your health care provider.  Avoid contact sports.  General instructions  Take over-the-counter and prescription medicines only as told by your health care provider. Anticoagulant medicines can have side effects, including easy bruising and difficulty stopping bleeding. If you are prescribed an anticoagulant, you will also need to do these things: ? Hold pressure over cuts for longer than usual. ? Tell your dentist and other health care providers that you are taking anticoagulants before you have any procedures in which bleeding may occur. ? Avoid contact sports.  Wear a medical alert bracelet or carry a medical alert card that says you have had a PE.  Ask your health care provider how soon you can go back to your normal activities. Stay active to prevent new blood clots from forming.  Make sure to exercise while traveling or when you have been sitting or standing for a long period of time. It is very important to exercise. Exercise your legs by walking or by tightening and relaxing your leg muscles often. Take frequent walks.  Wear compression stockings as told by your health care provider to help prevent more blood clots from forming.  Do not use tobacco products, including cigarettes, chewing tobacco, and e-cigarettes. If you need help quitting, ask your  health care provider.  Keep all follow-up appointments with your health care provider. This is important. How is this prevented? Take these actions to decrease your risk of developing another DVT:  Exercise regularly. For at least 30 minutes every day, engage in: ? Activity that involves moving your arms and legs. ? Activity that encourages good blood flow through your body by increasing your heart rate.  Exercise your arms and legs every hour during  long-distance travel (over 4 hours). Drink plenty of water and avoid drinking alcohol while traveling.  Avoid sitting or lying in bed for long periods of time without moving your legs.  Maintain a weight that is appropriate for your height. Ask your health care provider what weight is healthy for you.  If you are a woman who is over 5 years of age, avoid unnecessary use of medicines that contain estrogen. These include birth control pills.  Do not smoke, especially if you take estrogen medicines. If you need help quitting, ask your health care provider.  If you are hospitalized, prevention measures may include:  Early walking after surgery, as soon as your health care provider says that it is safe.  Receiving anticoagulants to prevent blood clots.If you cannot take anticoagulants, other options may be available, such as wearing compression stockings or using different types of devices.  Get help right away if:  You have new or increased pain, swelling, or redness in an arm or leg.  You have numbness or tingling in an arm or leg.  You have shortness of breath while active or at rest.  You have chest pain.  You have a rapid or irregular heartbeat.  You feel light-headed or dizzy.  You cough up blood.  You notice blood in your vomit, bowel movement, or urine. These symptoms may represent a serious problem that is an emergency. Do not wait to see if the symptoms will go away. Get medical help right away. Call your local emergency  services (911 in the [place] Do not drive yourself to the hospital. This information is not intended to replace advice given to you by your health care provider. Make sure you discuss any questions you have with your health care provider. Document Released: 03/06/2005 Document Revised: 08/12/2015 Document Reviewed: 07/01/2014 Elsevier Interactive Patient Education  2017 [employer].     Before your next abdominal ultrasound:  Take two Extra-Strength Gas-X capsules at bedtime the night before the test. Take another two Extra-Strength Gas-X capsules 3 hours before the test.  Avoid gas forming foods the day before the test.

## 2016-10-12 NOTE — Progress Notes (Signed)
[facility] [place]   CC: Follow up DVT,  stenting of left common and external iliac vein   History of Present Illness Roberto Sosa is a 56 y.o. male who returns for follow-up today. He underwent stenting of his left common and external iliac vein in December 2017 by Dr. Darrick PennaFields and Dr. Imogene Burnhen  after extensive DVT. He currently is taking Xarelto. His DVT occurred around the time of an injury to his left leg at work.  He no longer has swelling in his left lower extremity.   Review of systems: He denies shortness of breath. He denies chest pain. He denies hemoptysis. He has had no problems with bleeding from his Xarelto.  Dr. Darrick PennaFields last evaluated pt on 04-13-16. At that time feet with 2+ dorsalis pedis pulses bilaterally. Trace edema left lower extremity approximately 5% larger than the right lower extremity. Patient had a recent duplex exam which showed widely patent left common iliac vein stent  Doing well status post left iliac venous stenting for DVT.  Continue to Xarelto unless cost becomes an issue then switch to Coumadin. Plan initially will be to continue his anticoagulation for at least 6 months and then reassess. The patient will follow-up with our nurse practitioner in 6 months time with a repeat duplex at that point.  He denies any pain or swelling in his feet or legs, but does report pain in left foot after a left foot injury, usually bothers him when he climbs stairs.   He denies claudication symptoms with walking, denies any known history of stroke or TIA.   He continues to work.   Pt Diabetic: Yes, states his last A1C was 9.?, which he states has improved from 12.? Pt smoker: former smoker, quit in 2001, he started smokeless at that time  Pt meds include: Statin :Yes Betablocker: Yes ASA: No Other anticoagulants/antiplatelets: Xarelto   Past Medical History:  Diagnosis Date  . Coronary artery disease   . Diabetes mellitus without complication (HCC)    . High cholesterol     Social History Social History  Substance Use Topics  . Smoking status: Former Smoker    Packs/day: 2.00    Years: 20.00    Types: Cigarettes, Pipe, Cigars    Quit date: 2001  . Smokeless tobacco: Current User    Types: Chew  . Alcohol use Yes     Comment: 2 beers every 2 months    Family History Family History  Problem Relation Age of Onset  . Adopted: Yes    Past Surgical History:  Procedure Laterality Date  . CARDIAC CATHETERIZATION Left 03/09/2016   Procedure: Intravascular Ultrasound/IVUS;  Surgeon: Fransisco HertzBrian L Chen, MD;  [facility];  Service: Cardiovascular;  Laterality: Left;  IVC-LT COMMON ILIAC VEIN-LT EXR ILIAC VEIN-LT DEEP FEMORAL/POPLITEAL VEIN LYSIS AND THROMBECTOMY  . CARDIAC CATHETERIZATION Left 03/10/2016   Procedure: Intravascular Ultrasound/IVUS;  Surgeon: Sherren Kernsharles E Fields, MD;  [facility];  Service: Cardiovascular;  Laterality: Left;  Lower extremity venous.  . ORTHOPEDIC SURGERY Bilateral 06/2006   multiple fractures and breaks  . PERIPHERAL VASCULAR CATHETERIZATION Left 03/09/2016   Procedure: Lower Extremity Venography;  Surgeon: Fransisco HertzBrian L Chen, MD;  [facility];  Service: Cardiovascular;  Laterality: Left;  . PERIPHERAL VASCULAR CATHETERIZATION N/A 03/09/2016   Procedure: IVC Venography;  Surgeon: Fransisco HertzBrian L Chen, MD;  [facility];  Service: Cardiovascular;  Laterality: N/A;  . PERIPHERAL VASCULAR CATHETERIZATION Left 03/10/2016  Procedure: Lower Extremity Venography;  Surgeon: Sherren Kerns, MD;  Location: [facility] INVASIVE CV LAB;  Service: Cardiovascular;  Laterality: Left;  . PERIPHERAL VASCULAR CATHETERIZATION Left 03/10/2016   Procedure: Peripheral Vascular Intervention;  Surgeon: Sherren Kerns, MD;  Location: [facility] INVAS[facility] CV LAB;  Service: Cardiovascular;  Laterality: Left;  Left common and external iliac vein.      No Known Allergies  Current Outpatient  Prescriptions  Medication Sig Dispense Refill  . atorvastatin (LIPITOR) 80 MG tablet Take 80 mg by mouth daily at 6 PM.     . carvedilol (COREG) 12.5 MG tablet Take 12.5 mg by mouth 2 (two) times daily with a meal.     . Insulin Glargine-Lixisenatide 100-33 UNT-MCG/ML SOPN Inject into the skin.    . Insulin Glargine-Lixisenatide 100-33 UNT-MCG/ML SOPN Inject into the skin.    . metFORMIN (GLUCOPHAGE) 1000 MG tablet Take 1,000 mg by mouth.    . nitroGLYCERIN (NITROSTAT) 0.4 MG SL tablet Place 0.4 mg under the tongue.    . rivaroxaban (XARELTO) 20 MG TABS tablet Take 1 tablet (20 mg total) by mouth daily with supper. 30 tablet 3  . rivaroxaban (XARELTO) 20 MG TABS tablet Take 1 tablet (20 mg total) by mouth daily with supper. 30 tablet 6  . Rivaroxaban 15 & 20 MG TBPK Take as directed on package: Start with one 15mg  tablet by mouth twice a day with food. On Day 22, switch to one 20mg  tablet once a day with food. 51 each 0  . aspirin EC 81 MG tablet Take 81 mg by mouth daily.     Marland Kitchen gabapentin (NEURONTIN) 600 MG tablet Take 600 mg by mouth 3 (three) times daily.     Marland Kitchen glimepiride (AMARYL) 2 MG tablet Take 1 tablet (2 mg total) by mouth daily with breakfast. (Patient not taking: Reported on 10/12/2016) 30 tablet 0  . methocarbamol (ROBAXIN) 500 MG tablet Take 1 tablet (500 mg total) by mouth every 6 (six) hours as needed for muscle spasms. (Patient not taking: Reported on 08/29/2016) 30 tablet 2  . oxyCODONE-acetaminophen (PERCOCET/ROXICET) 5-325 MG tablet Take 1 tablet by mouth every 6 (six) hours as needed for moderate pain. (Patient not taking: Reported on 08/29/2016) 8 tablet 0   No current facility-administered medications for this visit.     ROS: See HPI for pertinent positives and negatives.   Physical Examination  Vitals:   10/12/16 0843  BP: 140/80  Pulse: 72  Resp: 18  Temp: (!) 97.5 F (36.4 C)  SpO2: 98%  Weight: 268 lb (121.6 kg)  Height: 6\' 1"  (1.854 m)   Body mass index is  35.36 kg/m.  General: A&O x 3, WDWN, obese male. Gait: normal Eyes: PERRLA. Pulmonary: Respirations are non labored, CTAB, good air movement Cardiac: regular rhythm and rate, no detected murmur.         Carotid Bruits Right Left   Negative Negative   Radial pulses are 2+ palpable bilaterally   Adominal aortic pulse is not palpable                         VASCULAR EXAM: Extremities without ischemic changes, without Gangrene; without open wounds. No swelling or erythema in either lower extremity.  LE Pulses Right Left       FEMORAL   palpable   palpable        POPLITEAL  not palpable    Not palpable       POSTERIOR TIBIAL   palpable    palpable        DORSALIS PEDIS      ANTERIOR TIBIAL  palpable   palpable    Abdomen: soft, NT, + reducible asymptomatic ventral hernia only present when pt is in the process lying supine or sitting up from supine position.  Skin: no rashes, no ulcers noted. Musculoskeletal: no muscle wasting or atrophy.  Neurologic: A&O X 3; Appropriate Affect ; SENSATION: normal; MOTOR FUNCTION:  moving all extremities equally, motor strength 5/5 throughout. Speech is fluent/normal. CN 2-12 intact.    ASSESSMENT: Roberto Sosa is a 56 y.o. male who is s/p stenting of his left common and external iliac vein in December 2017 after extensive DVT.  He has no swelling or erythema or pain in either lower extremity except for the dorsal aspect of his left foot after a heavy object fell on his foot, no swelling in the left foot other than a small hard prominence at mid dorsum of foot, no erythema, no open wound.   DATA Left Vena Cava-Iliac Vein Duplex (10/12/16): Decreased visualization of the abdominal vasculature due to overlying bowel gas and patient body habitus.  Patent common iliac and external iliac vein stent.  No significant change in comparison to the  last exam on 04-10-16.   PLAN:  Based on the patient's vascular studies and examination, and after discussing with Dr. Darrick PennaFields, pt will return to clinic in 1 year with left iliac vein duplex. At that time we will consider stopping the Xarelto.   The patient was counseled re smokeless tobacco cessation and given several free resources re this.  I discussed in depth with the patient the nature of atherosclerosis, and emphasized the importance of maximal medical management including strict control of blood pressure, blood glucose, and lipid levels, obtaining regular exercise, and cessation of smokeless tobacco use.  The patient is aware that without maximal medical management the underlying atherosclerotic disease process will progress, limiting the benefit of any interventions.  The patient was given information about PAD including signs, symptoms, treatment, what symptoms should prompt the patient to seek immediate medical care, and risk reduction measures to take.  Charisse MarchSuzanne Maggie Senseney, RN, MSN, FNP-C [facility] [place] Office Phone: [phone]  [facility]: [facility]  10/12/16 8:54 AM

## 2016-10-16 ENCOUNTER — Telehealth (INDEPENDENT_AMBULATORY_CARE_PROVIDER_SITE_OTHER): Payer: Self-pay | Admitting: Orthopaedic Surgery

## 2016-10-16 NOTE — Telephone Encounter (Signed)
[facility]

## 2016-10-19 ENCOUNTER — Encounter: Payer: Self-pay | Admitting: Vascular Surgery

## 2016-10-24 NOTE — Addendum Note (Signed)
Addended by: Burton ApleyPETTY, Rafia Shedden A on: 10/24/2016 03:37 PM   Modules accepted: Orders

## 2016-11-10 ENCOUNTER — Other Ambulatory Visit: Payer: Self-pay | Admitting: *Deleted

## 2017-07-24 DIAGNOSIS — I2699 Other pulmonary embolism without acute cor pulmonale: Secondary | ICD-10-CM | POA: Diagnosis not present

## 2017-07-24 DIAGNOSIS — I82409 Acute embolism and thrombosis of unspecified deep veins of unspecified lower extremity: Secondary | ICD-10-CM

## 2017-07-25 DIAGNOSIS — I82409 Acute embolism and thrombosis of unspecified deep veins of unspecified lower extremity: Secondary | ICD-10-CM | POA: Diagnosis not present

## 2017-07-25 DIAGNOSIS — I2699 Other pulmonary embolism without acute cor pulmonale: Secondary | ICD-10-CM | POA: Diagnosis not present

## 2017-07-27 DIAGNOSIS — Z86711 Personal history of pulmonary embolism: Secondary | ICD-10-CM

## 2017-07-27 HISTORY — DX: Personal history of pulmonary embolism: Z86.711

## 2017-08-28 DIAGNOSIS — R0600 Dyspnea, unspecified: Secondary | ICD-10-CM | POA: Insufficient documentation

## 2017-08-28 HISTORY — DX: Dyspnea, unspecified: R06.00

## 2017-10-23 DIAGNOSIS — R0602 Shortness of breath: Secondary | ICD-10-CM | POA: Diagnosis not present

## 2017-11-02 ENCOUNTER — Telehealth (INDEPENDENT_AMBULATORY_CARE_PROVIDER_SITE_OTHER): Payer: Self-pay | Admitting: Orthopaedic Surgery

## 2017-11-02 NOTE — Telephone Encounter (Signed)
Received vm from pt inquiring if we received request for records from [facility]. IC him back and left him vm advised him that we have not received anything from [facility]. I left our fax number for him to have his case worker to fax their request.

## 2017-11-12 ENCOUNTER — Ambulatory Visit (INDEPENDENT_AMBULATORY_CARE_PROVIDER_SITE_OTHER): Payer: Self-pay | Admitting: Family

## 2017-11-12 ENCOUNTER — Ambulatory Visit (HOSPITAL_COMMUNITY)
Admission: RE | Admit: 2017-11-12 | Discharge: 2017-11-12 | Disposition: A | Discharge status: Home / Self Care | Payer: Self-pay | Source: Ambulatory Visit | Attending: Family | Admitting: Family

## 2017-11-12 ENCOUNTER — Encounter: Payer: Self-pay | Admitting: Family

## 2017-11-12 VITALS — BP 108/65 | HR 59 | Temp 97.2°F | Resp 18 | Ht 73.0 in | Wt 273.0 lb

## 2017-11-12 DIAGNOSIS — I82422 Acute embolism and thrombosis of left iliac vein: Secondary | ICD-10-CM

## 2017-11-12 DIAGNOSIS — Z86711 Personal history of pulmonary embolism: Secondary | ICD-10-CM

## 2017-11-12 DIAGNOSIS — Z720 Tobacco use: Secondary | ICD-10-CM

## 2017-11-12 DIAGNOSIS — I824Y2 Acute embolism and thrombosis of unspecified deep veins of left proximal lower extremity: Secondary | ICD-10-CM

## 2017-11-12 DIAGNOSIS — Z86718 Personal history of other venous thrombosis and embolism: Secondary | ICD-10-CM | POA: Insufficient documentation

## 2017-11-12 DIAGNOSIS — E669 Obesity, unspecified: Secondary | ICD-10-CM | POA: Insufficient documentation

## 2017-11-12 DIAGNOSIS — M7989 Other specified soft tissue disorders: Secondary | ICD-10-CM | POA: Insufficient documentation

## 2017-11-12 NOTE — Progress Notes (Signed)
[facility] [place]   CC: Follow up DVT,  stenting of left common and external iliac vein, hx of PE   History of Present Illness Roberto Sosa is a 57 y.o. male who returns for follow-up today. He underwent stenting of his left common and external iliac vein in December 2017 by Dr. Darrick Penna and Dr. Imogene Burn  after extensive DVT. He was taking Xarelto, switched to Eliquis. His DVT occurred around the time of an injury to his left leg at work.  He no longer has swelling in his left lower extremity.   Review of systems: He denies shortness of breath. He denies chest pain. He denies hemoptysis. He has had no problems with bleeding from his Eliquis.  Dr. Darrick Penna last evaluated pt on 04-13-16. At that time feet with 2+ dorsalis pedis pulses bilaterally. Trace edema left lower extremity approximately 5% larger than the right lower extremity. Patient had a recent duplex exam which showed widely patent left common iliac vein stent  Doing well status post left iliac venous stenting for DVT.  Continue to Xarelto unless cost becomes an issue then switch to Coumadin. Plan initially will be to continue his anticoagulation for at least 6 months and then reassess. The patient was to follow-up with our nurse practitioner in 6 months time with a repeat duplex at that point.   He denies any pain or swelling in his feet or legs. He denies claudication symptoms with walking, denies any known history of stroke or TIA.  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   He states he was forced to resign his work in July 2018, he is trying to attain disability status.  His dyspnea limits his activity, states has improved since 07-22-17.   Xarelto became unaffordable for him, that was stopped, and he is taking Eliquis as prescribed by his PCP.   Pt Diabetic: Yes, states his last A1C was 9.?, which he states has improved from 12.? Pt smoker: former smoker, quit in 2001, he started smokeless tobacco at that  time  Pt meds include: Statin :Yes Betablocker: Yes ASA: No Other anticoagulants/antiplatelets: Eliquis    Past Medical History:  Diagnosis Date  . Coronary artery disease   . Diabetes mellitus without complication (HCC)   . High cholesterol     Social History Social History   Tobacco Use  . Smoking status: Former Smoker    Packs/day: 2.00    Years: 20.00    Pack years: 40.00    Types: Cigarettes, Pipe, Cigars    Last attempt to quit: 2001    Years since quitting: 18.6  . Smokeless tobacco: Current User    Types: Chew  Substance Use Topics  . Alcohol use: Yes    Comment: 2 beers every 2 months  . Drug use: No    Family History Family History  Adopted: Yes    Past Surgical History:  Procedure Laterality Date  . CARDIAC CATHETERIZATION Left 03/09/2016   Procedure: Intravascular Ultrasound/IVUS;  Surgeon: Fransisco Hertz, MD;  Location: [facility] INVAS[facility] CV LAB;  Service: Cardiovascular;  Laterality: Left;  IVC-LT COMMON ILIAC VEIN-LT EXR ILIAC VEIN-LT DEEP FEMORAL/POPLITEAL VEIN LYSIS AND THROMBECTOMY  . CARDIAC CATHETERIZATION Left 03/10/2016   Procedure: Intravascular Ultrasound/IVUS;  Surgeon: Sherren Kerns, MD;  Location: [facility] INVASIVE CV LAB;  Service: Cardiovascular;  Laterality: Left;  Lower extremity venous.  . ORTHOPEDIC SURGERY Bilateral 06/2006   multiple fractures and breaks  . PERIPHERAL VASCULAR CATHETERIZATION Left 03/09/2016  Procedure: Lower Extremity Venography;  Surgeon: Fransisco HertzBrian L Chen, MD;  [facility];  Service: Cardiovascular;  Laterality: Left;  . PERIPHERAL VASCULAR CATHETERIZATION N/A 03/09/2016   Procedure: IVC Venography;  Surgeon: Fransisco HertzBrian L Chen, MD;  [facility];  Service: Cardiovascular;  Laterality: N/A;  . PERIPHERAL VASCULAR CATHETERIZATION Left 03/10/2016   Procedure: Lower Extremity Venography;  Surgeon: Sherren Kernsharles E Fields, MD;  [facility];  Service: Cardiovascular;  Laterality: Left;  .  PERIPHERAL VASCULAR CATHETERIZATION Left 03/10/2016   Procedure: Peripheral Vascular Intervention;  Surgeon: Sherren Kernsharles E Fields, MD;  [facility];  Service: Cardiovascular;  Laterality: Left;  Left common and external iliac vein.      No Known Allergies  Current Outpatient Medications  Medication Sig Dispense Refill  . aspirin EC 81 MG tablet Take 81 mg by mouth daily.     Marland Kitchen. atorvastatin (LIPITOR) 80 MG tablet Take 80 mg by mouth daily at 6 PM.     . carvedilol (COREG) 12.5 MG tablet Take 12.5 mg by mouth 2 (two) times daily with a meal.     . ELIQUIS 5 MG TABS tablet   1  . gabapentin (NEURONTIN) 600 MG tablet Take 600 mg by mouth 3 (three) times daily.     . Insulin Glargine-Lixisenatide 100-33 UNT-MCG/ML SOPN Inject into the skin.    . Insulin Glargine-Lixisenatide 100-33 UNT-MCG/ML SOPN Inject into the skin.    . metFORMIN (GLUCOPHAGE) 1000 MG tablet Take 1,000 mg by mouth.    . nitroGLYCERIN (NITROSTAT) 0.4 MG SL tablet Place 0.4 mg under the tongue.    Marland Kitchen. glimepiride (AMARYL) 2 MG tablet Take 1 tablet (2 mg total) by mouth daily with breakfast. (Patient not taking: Reported on 10/12/2016) 30 tablet 0  . methocarbamol (ROBAXIN) 500 MG tablet Take 1 tablet (500 mg total) by mouth every 6 (six) hours as needed for muscle spasms. (Patient not taking: Reported on 08/29/2016) 30 tablet 2  . oxyCODONE-acetaminophen (PERCOCET/ROXICET) 5-325 MG tablet Take 1 tablet by mouth every 6 (six) hours as needed for moderate pain. (Patient not taking: Reported on 08/29/2016) 8 tablet 0  . rivaroxaban (XARELTO) 20 MG TABS tablet Take 1 tablet (20 mg total) by mouth daily with supper. (Patient not taking: Reported on 11/12/2017) 30 tablet 3  . rivaroxaban (XARELTO) 20 MG TABS tablet Take 1 tablet (20 mg total) by mouth daily with supper. (Patient not taking: Reported on 11/12/2017) 30 tablet 6  . Rivaroxaban 15 & 20 MG TBPK Take as directed on package: Start with one 15mg  tablet by mouth twice a  day with food. On Day 22, switch to one 20mg  tablet once a day with food. (Patient not taking: Reported on 11/12/2017) 51 each 0   No current facility-administered medications for this visit.     ROS: See HPI for pertinent positives and negatives.   Physical Examination  Vitals:   11/12/17 1311  BP: 108/65  Pulse: (!) 59  Resp: 18  Temp: (!) 97.2 F (36.2 C)  TempSrc: Oral  SpO2: 96%  Weight: 273 lb (123.8 kg)  Height: 6\' 1"  (1.854 m)   Body mass index is 36.02 kg/m.  General: A&O x 3, WDWN, obese male. Gait: normal Eyes: PERRLA. Pulmonary: Respirations are non labored, CTAB, fair air movement in all fields Cardiac: regular rhythm and rate, no detected murmur.         Carotid Bruits Right Left   Negative Negative   Radial  pulses are 2+ palpable bilaterally   Adominal aortic pulse is not palpable                         VASCULAR EXAM: Extremities without ischemic changes, without Gangrene; without open wounds. No swelling or erythema in either lower extremity.                                                                                                                                                        LE Pulses Right Left       FEMORAL   palpable   palpable        POPLITEAL  not palpable    Not palpable       POSTERIOR TIBIAL   palpable    palpable        DORSALIS PEDIS      ANTERIOR TIBIAL  palpable   palpable    Abdomen: soft, NT, + reducible asymptomatic ventral hernia only present when pt is in the process lying supine or sitting up from supine position.  Skin: no rashes, no ulcers noted. Musculoskeletal: no muscle wasting or atrophy.      Neurologic: A&O X 3; Appropriate Affect ; SENSATION: normal; MOTOR FUNCTION:  moving all extremities equally, motor strength 5/5 throughout. Speech is fluent/normal. CN 2-12 intact.    ASSESSMENT: Roberto Sosa is a 57 y.o. male who is s/p stenting of his left common and external iliac vein in  December 2017 after extensive DVT.  He has no swelling or erythema or pain in either lower extremity. His pedal pulses are palpable. His walking is limited by his dyspnea which has been stable.  His PCP prescribes Eliquis for him, Xarelto became unaffordable.  It seems he will likely need to be on lifelong anticoagulation.   His DM remains uncontrolled but seems to be improving. He continues to use smokeless tobacco. Over 3 minutes was spent counseling patient re smokeless tobacco use cessation, and patient was given several free resources re tobacco cessation.    DATA Left Vena Cava-Iliac Vein Duplex (11-12-17): Decreased visualization of the abdominal vasculature due to overlying bowel gas and patient body habitus.  Visualization of distal IVC, proximal common iliac, mid common iliac, distal common iliac, and proximal external iliac vein was limited due to air/bowel gas and obesity. The portions of the left external iliac vein that were observed are patent without thrombus visualized.  No significant change compared to the exam on 10-12-16.   PLAN:  Based on the patient's vascular studies and examination, pt will return to clinic in 6 months with left iliac vein duplex.  I advised pt to notify us if he develops swelling, redness,  or pain in either leg.   Rosalita Chessman Ibeth Fahmy, RN, MSN, FNP-C [facility]  [place] Phone: [phone]  Clinic MD: Ulla Gallo  11/12/17 1:23 PM

## 2017-11-12 NOTE — Patient Instructions (Addendum)
Before your next abdominal ultrasound:  Avoid gas forming foods and beverages the day before the test.   Take two Extra-Strength Gas-X capsules at bedtime the night before the test. Take another two Extra-Strength Gas-X capsules in the middle of the night if you get up to the restroom, if not, first thing in the morning with water.  Do not chew gum.     What You Need to Know About Smokeless Tobacco Use Tobacco use is one of the leading causes of cancer and other chronic health problems. Smokeless tobacco is tobacco that is put directly into the mouth instead of being smoked. It may also be called chewing tobacco or snuff. Smokeless tobacco is made from the leaves of tobacco plants and it comes in several forms:  Loose, dry leaves, plugs, or twists.  Moist pouches.  Dissolving lozenges or strips.  Chewing, sucking, or holding the tobacco in your mouth causes your mouth to make more saliva. The saliva mixes with the tobacco to make "tobacco juice" that is swallowed or spit out. How can smokeless tobacco affect me? Using smokeless tobacco:  Increases your risk of developing cancer. Smokeless tobacco contains at least 28 different types of cancer-causing chemicals (carcinogens).  Increases your chances of developing other long-term health problems, including high blood pressure, heart disease, stroke, and dental problems.  Can make you become addicted. Nicotine is one of the chemicals in tobacco. When you chew tobacco, you absorb nicotine from the tobacco juice. This can make you feel more alert than usual.  Can cause problems with pregnancy. Pregnant women who use smokeless tobacco are more likely to miscarry or deliver a baby too early (premature delivery).  Can affect the appearance and health of your mouth. Using smokeless tobacco may cause bad breath, yellow-brown teeth, mouth sores, cracking and bleeding lips, gum recession, and lesions on the soft tissues of your mouth  (leukoplakia).  What are the benefits of not using smokeless tobacco? The benefits of not using smokeless tobacco include:  A healthy mind because: ? You avoid addiction.  A healthy body because: ? You avoid dental problems. ? You promote healthy pregnancy. ? You avoid long-term health problems.  A healthy wallet because: ? You avoid costs of buying tobacco. ? You avoid health care costs in the future.  A healthy family because: ? You avoid accidental poisoning of children in your household.  What can happen if I continue to use smokeless tobacco? If you continue to use smokeless tobacco, you will increase your risk for developing certain cancers. These include:  Tongue.  Lips, mouth, and gums.  Throat (esophagus) and voice box (larynx).  Stomach.  Pancreas.  Bladder.  Colon.  Long-term use of smokeless tobacco can also lead to:  High blood pressure, heart disease, and stroke.  Gum disease, gum recession, and bone loss around the teeth.  Tooth decay.  How do I quit using smokeless tobacco? Quitting the use of smokeless tobacco can be hard, but it can be done. Follow these steps:  Pick a date to quit. Set a date within the next two weeks. This gives you time to prepare.  Write down the reasons why you are quitting. Keep this list in places where you will see it often, such as on your bathroom mirror or in your car or wallet.  Identify the people, places, things, and activities that make you want to use tobacco (triggers) and avoid them.  Get rid of any tobacco you have and remove any tobacco  smells. To do this: ? Throw away all containers of tobacco at home, at work, and in your car. ? Throw away any other items that you use regularly when you chew tobacco. ? Clean your car and make sure to remove all tobacco-related items. ? Clean your home, including curtains and carpets.  Tell your family, friends, and coworkers that you are quitting. This can make  quitting easier.  Ask your health care provider for help quitting smokeless tobacco. This may involve treatment. Find out what treatment options are covered by your health insurance.  Keep track of how many days have passed since you quit. Remembering how long and hard you have worked to quit can help you avoid using tobacco again.  Where can I get support? Ask your health care provider if there is a local support group for quitting smokeless tobacco. Where can I get more information? You can learn more about the risks of using smokeless tobacco and the benefits of quitting from these sources:  National Cancer Institute: www.cancer.gov  American Cancer Society: www.cancer.org  When should I seek medical care? Seek medical care if you have:  White or other discolored patches in your mouth.  Difficulty swallowing.  A change in your voice.  Unexplained weight loss.  Stomach pain, nausea, or vomiting.  Summary  Smokeless tobacco contains at least 28 different chemicals that are known to cause cancer (carcinogen).  Nicotine is an addictive chemical in smokeless tobacco.  When you quit using smokeless tobacco, you lower your risk of developing cancer. This information is not intended to replace advice given to you by your health care provider. Make sure you discuss any questions you have with your health care provider. Document Released: 08/08/2010 Document Revised: 10/30/2015 Document Reviewed: 10/16/2014 Elsevier Interactive Patient Education  2018 [employer] Inc[employer]     Deep Vein Thrombosis Deep vein thrombosis (DVT) is a condition in which a blood clot forms in a deep vein, such as a lower leg, thigh, or arm vein. A clot is blood that has thickened into a gel or solid. This condition is dangerous. It can lead to serious and even life-threatening complications if the clot travels to the lungs and causes a blockage (pulmonary embolism). It can also damage veins in the leg. This can  result in leg pain, swelling, discoloration, and sores (post-thrombotic syndrome). What are the causes? This condition may be caused by:  A slowdown of blood flow.  Damage to a vein.  A condition that makes blood clot more easily.  What increases the risk? The following factors may make you more likely to develop this condition:  Being overweight.  Being elderly, especially over age 1.  Sitting or lying down for more than four hours.  Lack of physical activity (sedentary lifestyle).  Being pregnant, giving birth, or having recently given birth.  Taking medicines that contain estrogen.  Smoking.  A history of any of the following: ? Blood clots or blood clotting disease. ? Peripheral vascular disease. ? Inflammatory bowel disease. ? Cancer. ? Heart disease. ? Genetic conditions that affect how blood clots. ? Neurological diseases that affect the legs (leg paresis). ? Injury. ? Major or lengthy surgery. ? A central line placed inside a large vein.  What are the signs or symptoms? Symptoms of this condition include:  Swelling, pain, or tenderness in an arm or leg.  Warmth, redness, or discoloration in an arm or leg.  If the clot is in your leg, symptoms may be more noticeable  or worse when you stand or walk. Some people do not have any symptoms. How is this diagnosed? This condition is diagnosed with:  A medical history.  A physical exam.  Tests, such as: ? Blood tests. These are done to see how your blood clots. ? Imaging tests. These are done to check for clots. Tests may include:  Ultrasound.  CT scan.  MRI.  X-ray.  Venogram. For this test, X-rays are taken after a dye is injected into a vein.  How is this treated? Treatment for this condition depends on the cause, your risk for bleeding or developing more clots, and any medical conditions you have. Treatment may include:  Taking blood thinners (also called anticoagulants). These medicines may  be taken by mouth, injected under the skin, or injected through an IV tube (catheter). These medicines prevent clots from forming.  Injecting medicine that dissolves blood clots into the affected vein (catheter-directed thrombolysis).  Having surgery. Surgery may be done to: ? Remove the clot. ? Place a filter in a large vein to catch blood clots before they reach the lungs.  Some treatments may be continued for up to six months. Follow these instructions at home: If you are taking an oral blood thinner:  Take the medicine exactly as told by your health care provider. Some blood thinners need to be taken at the same time every day. Do not skip a dose.  Ask your health care provider about what foods and drugs interact with the medicine.  Ask about possible side effects. General instructions  Blood thinners can cause easy bruising and difficulty stopping bleeding. Because of this, if you are taking or were given a blood thinner: ? Hold pressure over cuts for longer than usual. ? Tell your dentist and other health care providers that you are taking blood thinners before having any procedures that can cause bleeding. ? Avoid contact sports.  Take over-the-counter and prescription medicines only as told by your health care provider.  Return to your normal activities as told by your health care provider. Ask your health care provider what activities are safe for you.  Wear compression stockings if recommended by your health care provider.  Keep all follow-up visits as told by your health care provider. This is important. How is this prevented? To lower your risk of developing this condition again:  For 30 or more minutes every day, do an activity that: ? Involves moving your arms and legs. ? Increases your heart rate.  When traveling for longer than four hours: ? Exercise your arms and legs every hour. ? Drink plenty of water. ? Avoid drinking alcohol.  Avoid sitting or lying for  a long time without moving your legs.  Stay a healthy weight.  If you are a woman who is older than age 77, avoid unnecessary use of medicines that contain estrogen.  Do not use any products that contain nicotine or tobacco, such as cigarettes and e-cigarettes. This is especially important if you take estrogen medicines. If you need help quitting, ask your health care provider.  Contact a health care provider if:  You miss a dose of your blood thinner.  You have nausea, vomiting, or diarrhea that lasts for more than one day.  Your menstrual period is heavier than usual.  You have unusual bruising. Get help right away if:  You have new or increased pain, swelling, or redness in an arm or leg.  You have numbness or tingling in an arm or leg.  You have shortness of breath.  You have chest pain.  You have a rapid or irregular heartbeat.  You feel light-headed or dizzy.  You cough up blood.  There is blood in your vomit, stool, or urine.  You have a serious fall or accident, or you hit your head.  You have a severe headache or confusion.  You have a cut that will not stop bleeding. These symptoms may represent a serious problem that is an emergency. Do not wait to see if the symptoms will go away. Get medical help right away. Call your local emergency services (911 in the [place] Do not drive yourself to the hospital. Summary  DVT is a condition in which a blood clot forms in a deep vein, such as a lower leg, thigh, or arm vein.  Symptoms can include swelling, warmth, pain, and redness in your leg or arm.  Treatment may include taking blood thinners, injecting medicine that dissolves blood clots,wearing compression stockings, or surgery.  If you are prescribed blood thinners, take them exactly as told. This information is not intended to replace advice given to you by your health care provider. Make sure you discuss any questions you have with your health care  provider. Document Released: 03/06/2005 Document Revised: 04/08/2016 Document Reviewed: 04/08/2016 Elsevier Interactive Patient Education  2018 [employer].

## 2018-03-22 ENCOUNTER — Other Ambulatory Visit: Payer: Self-pay | Admitting: *Deleted

## 2018-03-22 DIAGNOSIS — Z86718 Personal history of other venous thrombosis and embolism: Secondary | ICD-10-CM

## 2018-04-30 NOTE — Progress Notes (Signed)
HISTORY AND PHYSICAL     CC:  Follow up Requesting Provider:  Olive Bass, MD  HPI: This is a 58 y.o. male who underwent stenting of his left CIV and EIV in December 2017 by Dr. Darrick Penna and Dr. Imogene Burn after having extensive DVT.  He was taking Xarelto and switched to Eliquis.  At his last visit, he was doing well without swelling in his LLE.    He is here today for follow up.  He states that he is not having any leg swelling and doing well from that standpoint.  He states that sometimes at night he feels like the skin is being ripped off his toes. He takes gabapentin.  He does have some shortness of breath that he states is from blood clots in his lungs.  He continues to take his Eliquis and is followed by his PCP in [place].    The pt is on a statin for cholesterol management.  The pt is diabetic.   The pt is on a BB for hypertension.   Tobacco hx:  Remote-quit 2001; +for smokeless tobacco The pt is on a daily aspirin.  He is on Eliquis  Past Medical History:  Diagnosis Date  . Coronary artery disease   . Diabetes mellitus without complication (HCC)   . High cholesterol     Past Surgical History:  Procedure Laterality Date  . CARDIAC CATHETERIZATION Left 03/09/2016   Procedure: Intravascular Ultrasound/IVUS;  Surgeon: Fransisco Hertz, MD;  [facility] INVASIVE CV LAB;  Service: Cardiovascular;  Laterality: Left;  IVC-LT COMMON ILIAC VEIN-LT EXR ILIAC VEIN-LT DEEP FEMORAL/POPLITEAL VEIN LYSIS AND THROMBECTOMY  . CARDIAC CATHETERIZATION Left 03/10/2016   Procedure: Intravascular Ultrasound/IVUS;  Surgeon: Sherren Kerns, MD;  Location: [facility] INVASIVE CV LAB;  Service: Cardiovascular;  Laterality: Left;  Lower extremity venous.  . ORTHOPEDIC SURGERY Bilateral 06/2006   multiple fractures and breaks  . PERIPHERAL VASCULAR CATHETERIZATION Left 03/09/2016   Procedure: Lower Extremity Venography;  Surgeon: Fransisco Hertz, MD;  Location: [facility] INVASIVE CV LAB;  Service: Cardiovascular;   Laterality: Left;  . PERIPHERAL VASCULAR CATHETERIZATION N/A 03/09/2016   Procedure: IVC Venography;  Surgeon: Fransisco Hertz, MD;  Location: [facility] INVASIVE CV LAB;  Service: Cardiovascular;  Laterality: N/A;  . PERIPHERAL VASCULAR CATHETERIZATION Left 03/10/2016   Procedure: Lower Extremity Venography;  Surgeon: Sherren Kerns, MD;  Location: [facility] INVASIVE CV LAB;  Service: Cardiovascular;  Laterality: Left;  . PERIPHERAL VASCULAR CATHETERIZATION Left 03/10/2016   Procedure: Peripheral Vascular Intervention;  Surgeon: Sherren Kerns, MD;  Location: [facility] INVASIVE CV LAB;  Service: Cardiovascular;  Laterality: Left;  Left common and external iliac vein.      No Known Allergies  Current Outpatient Medications  Medication Sig Dispense Refill  . aspirin EC 81 MG tablet Take 81 mg by mouth daily.     Marland Kitchen atorvastatin (LIPITOR) 80 MG tablet Take 80 mg by mouth daily at 6 PM.     . carvedilol (COREG) 12.5 MG tablet Take 12.5 mg by mouth 2 (two) times daily with a meal.     . ELIQUIS 5 MG TABS tablet   1  . gabapentin (NEURONTIN) 600 MG tablet Take 600 mg by mouth 3 (three) times daily.     Marland Kitchen glimepiride (AMARYL) 2 MG tablet Take 1 tablet (2 mg total) by mouth daily with breakfast. (Patient not taking: Reported on 10/12/2016) 30 tablet 0  . Insulin Glargine-Lixisenatide 100-33 UNT-MCG/ML SOPN Inject into the skin.    Marland Kitchen  Insulin Glargine-Lixisenatide 100-33 UNT-MCG/ML SOPN Inject into the skin.    . metFORMIN (GLUCOPHAGE) 1000 MG tablet Take 1,000 mg by mouth.    . methocarbamol (ROBAXIN) 500 MG tablet Take 1 tablet (500 mg total) by mouth every 6 (six) hours as needed for muscle spasms. (Patient not taking: Reported on 08/29/2016) 30 tablet 2  . nitroGLYCERIN (NITROSTAT) 0.4 MG SL tablet Place 0.4 mg under the tongue.    Marland Kitchen. oxyCODONE-acetaminophen (PERCOCET/ROXICET) 5-325 MG tablet Take 1 tablet by mouth every 6 (six) hours as needed for moderate pain. (Patient not taking: Reported on 08/29/2016) 8 tablet 0    . rivaroxaban (XARELTO) 20 MG TABS tablet Take 1 tablet (20 mg total) by mouth daily with supper. (Patient not taking: Reported on 11/12/2017) 30 tablet 3  . rivaroxaban (XARELTO) 20 MG TABS tablet Take 1 tablet (20 mg total) by mouth daily with supper. (Patient not taking: Reported on 11/12/2017) 30 tablet 6  . Rivaroxaban 15 & 20 MG TBPK Take as directed on package: Start with one 15mg  tablet by mouth twice a day with food. On Day 22, switch to one 20mg  tablet once a day with food. (Patient not taking: Reported on 11/12/2017) 51 each 0   No current facility-administered medications for this visit.     Family History  Adopted: Yes    Social History   Socioeconomic History  . Marital status: Divorced    Spouse name: Not on file  . Number of children: Not on file  . Years of education: Not on file  . Highest education level: Not on file  Occupational History  . Occupation: "Human resources officerloater"    Employer: [employer]    Comment: Media plannerMachinery operator  Social Needs  . Financial resource strain: Not on file  . Food insecurity:    Worry: Not on file    Inability: Not on file  . Transportation needs:    Medical: Not on file    Non-medical: Not on file  Tobacco Use  . Smoking status: Former Smoker    Packs/day: 2.00    Years: 20.00    Pack years: 40.00    Types: Cigarettes, Pipe, Cigars    Last attempt to quit: 2001    Years since quitting: 19.1  . Smokeless tobacco: Current User    Types: Chew  Substance and Sexual Activity  . Alcohol use: Yes    Comment: 2 beers every 2 months  . Drug use: No  . Sexual activity: Not Currently  Lifestyle  . Physical activity:    Days per week: Not on file    Minutes per session: Not on file  . Stress: Not on file  Relationships  . Social connections:    Talks on phone: Not on file    Gets together: Not on file    Attends religious service: Not on file    Active member of club or organization: Not on file    Attends meetings of clubs or  organizations: Not on file    Relationship status: Not on file  . Intimate partner violence:    Fear of current or ex partner: Not on file    Emotionally abused: Not on file    Physically abused: Not on file    Forced sexual activity: Not on file  Other Topics Concern  . Not on file  Social History Narrative  . Not on file     REVIEW OF SYSTEMS:   [X]  denotes positive finding, [ ]  denotes negative finding Cardiac  Comments:  Chest pain or chest pressure:    Shortness of breath upon exertion: x   Short of breath when lying flat:    Irregular heart rhythm:        Vascular    Pain in calf, thigh, or hip brought on by ambulation:    Pain in feet at night that wakes you up from your sleep:  x See HPI  Blood clot in your veins:    Leg swelling:         Pulmonary    Oxygen at home:    Productive cough:     Wheezing:         Neurologic    Sudden weakness in arms or legs:     Sudden numbness in arms or legs:     Sudden onset of difficulty speaking or slurred speech:    Temporary loss of vision in one eye:  x Right eye vision loss but not temporary  Problems with dizziness:  x       Gastrointestinal    Blood in stool:     Vomited blood:         Genitourinary    Burning when urinating:     Blood in urine:        Psychiatric    Major depression:         Hematologic    Bleeding problems:    Problems with blood clotting too easily:        Skin    Rashes or ulcers:        Constitutional    Fever or chills:      PHYSICAL EXAMINATION:  Today's Vitals   05/02/18 0845  BP: 117/77  Pulse: 74  Resp: 18  SpO2: 96%  Weight: 273 lb (123.8 kg)  Height: 6\' 1"  (1.854 m)   Body mass index is 36.02 kg/m.   General:  WDWN in NAD; vital signs documented above Gait: Normal HENT: WNL, normocephalic Pulmonary: normal non-labored breathing , without Rales, rhonchi,  wheezing Cardiac: regular HR, without  Murmurs without carotid bruits Abdomen: soft, NT, no  masses Skin: without rashes Vascular Exam/Pulses:  Right Left  Radial 2+ (normal) 2+ (normal)  Ulnar Unable to palpate  Unable to palpate   DP 2+ (normal) 2+ (normal)  PT Unable to palpate  Unable to palpate    Extremities: without ischemic changes, without Gangrene , without cellulitis; without open wounds; no swelling BLE Musculoskeletal: no muscle wasting or atrophy  Neurologic: A&O X 3;  No focal weakness or paresthesias are detected Psychiatric:  The pt has Normal affect.   Non-Invasive Vascular Imaging:   IVC and left iliac vein duplex 05/02/2018:    ASSESSMENT/PLAN:: 58 y.o. male who is s/p stenting of his left CIV and EIV in December 2017 by Dr. Darrick Penna and Dr. Imogene Burn after having extensive DVT.   -pt doing well and stents are patent.  He does not have any swelling in his legs.  He will continue life long anticoagulation that is managed by his PCP.  He is on Eliquis due to cost of Xarelto. -he will f/u in one year with repeat duplex.  He will call sooner should he have any issues.    Doreatha Massed, PA-C [facility] [phone]  Clinic MD:   Darrick Penna

## 2018-05-01 ENCOUNTER — Ambulatory Visit: Payer: Self-pay | Admitting: Family

## 2018-05-01 ENCOUNTER — Encounter (HOSPITAL_COMMUNITY): Payer: Self-pay

## 2018-05-02 ENCOUNTER — Other Ambulatory Visit: Payer: Self-pay

## 2018-05-02 ENCOUNTER — Ambulatory Visit (INDEPENDENT_AMBULATORY_CARE_PROVIDER_SITE_OTHER): Payer: Self-pay | Admitting: Physician Assistant

## 2018-05-02 ENCOUNTER — Ambulatory Visit (HOSPITAL_COMMUNITY)
Admission: RE | Admit: 2018-05-02 | Discharge: 2018-05-02 | Disposition: A | Discharge status: Home / Self Care | Payer: Medicaid Other | Source: Ambulatory Visit | Attending: Family | Admitting: Family

## 2018-05-02 ENCOUNTER — Encounter: Payer: Self-pay | Admitting: Family

## 2018-05-02 VITALS — BP 117/77 | HR 74 | Resp 18 | Ht 73.0 in | Wt 273.0 lb

## 2018-05-02 DIAGNOSIS — Z86718 Personal history of other venous thrombosis and embolism: Secondary | ICD-10-CM

## 2018-05-02 DIAGNOSIS — I82422 Acute embolism and thrombosis of left iliac vein: Secondary | ICD-10-CM

## 2018-06-18 DIAGNOSIS — Z0279 Encounter for issue of other medical certificate: Secondary | ICD-10-CM | POA: Diagnosis not present

## 2018-11-17 DIAGNOSIS — Z7901 Long term (current) use of anticoagulants: Secondary | ICD-10-CM

## 2018-11-17 HISTORY — DX: Long term (current) use of anticoagulants: Z79.01

## 2018-11-21 ENCOUNTER — Encounter: Payer: Self-pay | Admitting: Cardiology

## 2018-11-21 ENCOUNTER — Encounter: Payer: Self-pay | Admitting: *Deleted

## 2018-11-22 ENCOUNTER — Encounter: Payer: Self-pay | Admitting: Cardiology

## 2018-11-22 ENCOUNTER — Other Ambulatory Visit: Payer: Self-pay

## 2018-11-22 ENCOUNTER — Ambulatory Visit (INDEPENDENT_AMBULATORY_CARE_PROVIDER_SITE_OTHER): Payer: Medicaid Other | Admitting: Cardiology

## 2018-11-22 VITALS — BP 110/76 | HR 69 | Ht 73.0 in | Wt 270.0 lb

## 2018-11-22 DIAGNOSIS — E1129 Type 2 diabetes mellitus with other diabetic kidney complication: Secondary | ICD-10-CM | POA: Diagnosis not present

## 2018-11-22 DIAGNOSIS — R0609 Other forms of dyspnea: Secondary | ICD-10-CM | POA: Diagnosis not present

## 2018-11-22 DIAGNOSIS — Z86711 Personal history of pulmonary embolism: Secondary | ICD-10-CM | POA: Diagnosis not present

## 2018-11-22 DIAGNOSIS — Z794 Long term (current) use of insulin: Secondary | ICD-10-CM

## 2018-11-22 DIAGNOSIS — E782 Mixed hyperlipidemia: Secondary | ICD-10-CM | POA: Diagnosis not present

## 2018-11-22 DIAGNOSIS — I25118 Atherosclerotic heart disease of native coronary artery with other forms of angina pectoris: Secondary | ICD-10-CM

## 2018-11-22 DIAGNOSIS — R809 Proteinuria, unspecified: Secondary | ICD-10-CM

## 2018-11-22 DIAGNOSIS — Z86718 Personal history of other venous thrombosis and embolism: Secondary | ICD-10-CM

## 2018-11-22 NOTE — Progress Notes (Signed)
Cardiology Office Note:    Date:  11/22/2018   ID:  Roberto Sosa, DOB 1961-01-23, MRN [id number]  PCP:  Olive Bassough, Robert L, MD  Cardiologist:  Garwin Brothersajan R Montravious Weigelt, MD   Referring MD: Olive Bassough, Robert L, MD    ASSESSMENT:    1. Atherosclerosis of native coronary artery of native heart with stable angina pectoris (HCC)   2. Type 2 diabetes mellitus with microalbuminuria, with long-term current use of insulin (HCC)   3. Mixed hyperlipidemia   4. History of pulmonary embolism   5. History of deep venous thrombosis    PLAN:    In order of problems listed above:  1. Dyspnea on exertion: I discussed my findings with the patient at extensive length.  Because of his orthopedic issues he has problem with ambulation.  To evaluate for any coronary etiology we will do a Lexiscan sestamibi.  Echocardiogram will be also done to assess murmur heard on auscultation. 2. Coronary artery disease: Details not available to me.  At any rate he will need aggressive secondary prevention since he is a diabetic and this is managed by his primary care physician.  He has history of myocardial infarction in the past. 3. Mixed dyslipidemia: Lipids followed by primary care physician.  Diet was discussed and weight reduction was stressed. 4. Patient will be seen in follow-up appointment in 6 months or earlier if the patient has any concerns    Medication Adjustments/Labs and Tests Ordered: Current medicines are reviewed at length with the patient today.  Concerns regarding medicines are outlined above.  No orders of the defined types were placed in this encounter.  No orders of the defined types were placed in this encounter.    History of Present Illness:    Roberto Sosa is a 58 y.o. male who is being seen today for the evaluation of dyspnea on exertion at the request of Dough, Doris Cheadleobert L, MD.  Patient is a pleasant 58 year old male.  He has had significant history in the past.  There is mention of coronary artery  disease.  I do not have the details.  He does mention to me that he has had a heart attack in the past.  He has history of essential hypertension, dyslipidemia, diabetes mellitus and is a ex-smoker.  He is overweight.  He mentions to me that he has dyspnea on exertion and it has been steady and he is here for that evaluation.  I reviewed primary care physician's records extensively and he mentioned coronary artery disease.  At the time of my evaluation, the patient is alert awake oriented and in no distress.  Past Medical History:  Diagnosis Date  . Atherosclerosis of native coronary artery of native heart with stable angina pectoris (HCC) 06/09/2015  . Coronary artery disease   . Diabetes mellitus without complication (HCC)   . DVT (deep venous thrombosis) (HCC) 03/07/2016  . Dyspnea 08/28/2017   2019: onset after PE  . High cholesterol   . History of deep venous thrombosis 03/07/2016   Overview:  2017: LLE, extensive, thrombectomy and 2 venous stents, no PE . Post-trauma. Xarelto  . History of pulmonary embolism 07/27/2017   2019: occurred off NOAC  . Left thigh pain 03/03/2016  . Mixed hyperlipidemia 06/09/2015  . Nephrolithiasis 03/14/2016  . Type 2 diabetes mellitus with microalbuminuria, with long-term current use of insulin (HCC) 06/09/2015   2018: UACR=200    Past Surgical History:  Procedure Laterality Date  . CARDIAC CATHETERIZATION Left 03/09/2016   Procedure:  Intravascular Ultrasound/IVUS;  Surgeon: Conrad [place], MD;  Location: [place] [facility];  Service: Cardiovascular;  Laterality: Left;  IVC-LT COMMON ILIAC VEIN-LT EXR ILIAC VEIN-LT DEEP FEMORAL/POPLITEAL VEIN LYSIS AND THROMBECTOMY  . CARDIAC CATHETERIZATION Left 03/10/2016   Procedure: Intravascular Ultrasound/IVUS;  Surgeon: Elam Dutch, MD;  Location: [place];  Service: Cardiovascular;  Laterality: Left;  Lower extremity venous.  Marland Kitchen HUMERUS FRACTURE SURGERY Left   . ORTHOPEDIC SURGERY Bilateral 06/2006    multiple fractures and breaks  . PERIPHERAL VASCULAR CATHETERIZATION Left 03/09/2016   Procedure: Lower Extremity Venography;  Surgeon: Conrad Hitchcock, MD;  Location: [facility] [facility];  Service: Cardiovascular;  Laterality: Left;  . PERIPHERAL VASCULAR CATHETERIZATION N/A 03/09/2016   Procedure: IVC Venography;  Surgeon: Conrad [place], MD;  Location: [place] [facility];  Service: Cardiovascular;  Laterality: N/A;  . PERIPHERAL VASCULAR CATHETERIZATION Left 03/10/2016   Procedure: Lower Extremity Venography;  Surgeon: Elam Dutch, MD;  Location: [place] [facility];  Service: Cardiovascular;  Laterality: Left;  . PERIPHERAL VASCULAR CATHETERIZATION Left 03/10/2016   Procedure: Peripheral Vascular Intervention;  Surgeon: Elam Dutch, MD;  Location: [place] [facility];  Service: Cardiovascular;  Laterality: Left;  Left common and external iliac vein.    Marland Kitchen SHOULDER ARTHROSCOPY Right     Current Medications: Current Meds  Medication Sig  . carvedilol (COREG) 25 MG tablet TAKE 1 TABLET BY MOUTH EVERY 12 HOURS FOR HEART AND BLOOD PRESSURE.  Marland Kitchen ELIQUIS 5 MG TABS tablet Take by mouth.   . ezetimibe (ZETIA) 10 MG tablet Take 10 mg by mouth every morning.  . gabapentin (NEURONTIN) 600 MG tablet Take 600 mg by mouth 3 (three) times daily.   . Insulin Glargine-Lixisenatide 100-33 UNT-MCG/ML SOPN Inject into the skin.  Marland Kitchen insulin lispro (HUMALOG) 100 UNIT/ML injection Inject into the skin 3 (three) times daily before meals.  . metFORMIN (GLUCOPHAGE) 1000 MG tablet Take 1,000 mg by mouth.  . nitroGLYCERIN (NITROSTAT) 0.4 MG SL tablet Place 0.4 mg under the tongue.  . rosuvastatin (CRESTOR) 40 MG tablet Take 40 mg by mouth daily.     Allergies:   Patient has no known allergies.   Social History   Socioeconomic History  . Marital status: Widowed    Spouse name: Not on file  . Number of children: Not on file  . Years of education: Not on file  . Highest education level: Not on file   Occupational History  . 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 Financial resource strain: Not on file  . Food insecurity    Worry: Not on file    Inability: Not on file  . Transportation needs    Medical: Not on file    Non-medical: Not on file  Tobacco Use  . Smoking status: Former Smoker    Packs/day: 2.00    Years: 20.00    Pack years: 40.00    Types: Cigarettes, Pipe, Cigars    Quit date: 2001    Years since quitting: 19.6  . Smokeless tobacco: Current User    Types: Chew  Substance and Sexual Activity  . Alcohol use: Yes    Comment: 2 beers every 2 months  . Drug use: No  . Sexual activity: Not Currently  Lifestyle  . Physical activity    Days per week: Not on file    Minutes per session: Not on file  .  Stress: Not on file  Relationships  . Social Musician on phone: Not on file    Gets together: Not on file    Attends religious service: Not on file    Active member of club or organization: Not on file    Attends meetings of clubs or organizations: Not on file    Relationship status: Not on file  Other Topics Concern  . Not on file  Social History Narrative  . Not on file     Family History: The patient's family history is not on file. He was adopted.  ROS:   Please see the history of present illness.    All other systems reviewed and are negative.  EKGs/Labs/Other Studies Reviewed:    The following studies were reviewed today: Records from primary care physician was reviewed.  EKG reveals sinus rhythm and nonspecific ST-T changes   Recent Labs: No results found for requested labs within last 8760 hours.  Recent Lipid Panel No results found for: CHOL, TRIG, HDL, CHOLHDL, VLDL, LDLCALC, LDLDIRECT  Physical Exam:    VS:  BP 110/76 (BP Location: Left Arm, Patient Position: Sitting, Cuff Size: Normal)   Pulse 69   Ht 6\' 1"  (1.854 m)   Wt 270 lb (122.5 kg)   SpO2 97%   BMI 35.62  kg/m     Wt Readings from Last 3 Encounters:  11/22/18 270 lb (122.5 kg)  05/02/18 273 lb (123.8 kg)  11/12/17 273 lb (123.8 kg)     GEN: Patient is in no acute distress HEENT: Normal NECK: No JVD; No carotid bruits LYMPHATICS: No lymphadenopathy CARDIAC: S1 S2 regular, 2/6 systolic murmur at the apex. RESPIRATORY:  Clear to auscultation without rales, wheezing or rhonchi  ABDOMEN: Soft, non-tender, non-distended MUSCULOSKELETAL:  No edema; No deformity  SKIN: Warm and dry NEUROLOGIC:  Alert and oriented x 3 PSYCHIATRIC:  Normal affect    Signed, Garwin Brothers, MD  11/22/2018 11:19 AM    [facility]

## 2018-11-22 NOTE — Patient Instructions (Signed)
Medication Instructions:  Your physician recommends that you continue on your current medications as directed. Please refer to the Current Medication list given to you today.  If you need a refill on your cardiac medications before your next appointment, please call your pharmacy.   Lab work: NONE If you have labs (blood work) drawn today and your tests are completely normal, you will receive your results only by: Marland Kitchen MyChart Message (if you have MyChart) OR . A paper copy in the mail If you have any lab test that is abnormal or we need to change your treatment, we will call you to review the results.  Testing/Procedures: YOU had an EKG performed today.   Your physician has requested that you have a lexiscan myoview. For further information please visit [email or web address]. Please follow instruction sheet, as given.  Your physician has requested that you have an echocardiogram. Echocardiography is a painless test that uses sound waves to create images of your heart. It provides your doctor with information about the size and shape of your heart and how well your heart's chambers and valves are working. This procedure takes approximately one hour. There are no restrictions for this procedure.    Follow-Up: At [facility], you and your health needs are our priority.  As part of our continuing mission to provide you with exceptional heart care, we have created designated Provider Care Teams.  These Care Teams include your primary Cardiologist (physician) and Advanced Practice Providers (APPs -  Physician Assistants and Nurse Practitioners) who all work together to provide you with the care you need, when you need it. You will need a follow up appointment in 3 months.   Any Other Special Instructions Will Be Listed Below Regadenoson injection What is this medicine? REGADENOSON is used to test the heart for coronary artery disease. It is used in patients who can not exercise for their stress  test. This medicine may be used for other purposes; ask your health care provider or pharmacist if you have questions. COMMON BRAND NAME(S): Lexiscan What should I tell my health care provider before I take this medicine? They need to know if you have any of these conditions:  heart problems  lung or breathing disease, like asthma or COPD  an unusual or allergic reaction to regadenoson, other medicines, foods, dyes, or preservatives  pregnant or trying to get pregnant  breast-feeding How should I use this medicine? This medicine is for injection into a vein. It is given by a health care professional in a hospital or clinic setting. Talk to your pediatrician regarding the use of this medicine in children. Special care may be needed. Overdosage: If you think you have taken too much of this medicine contact a poison control center or emergency room at once. NOTE: This medicine is only for you. Do not share this medicine with others. What if I miss a dose? This does not apply. What may interact with this medicine?  caffeine  dipyridamole  guarana  theophylline This list may not describe all possible interactions. Give your health care provider a list of all the medicines, herbs, non-prescription drugs, or dietary supplements you use. Also tell them if you smoke, drink alcohol, or use illegal drugs. Some items may interact with your medicine. What should I watch for while using this medicine? Your condition will be monitored carefully while you are receiving this medicine. Do not take medicines, foods, or drinks with caffeine (like coffee, tea, or colas) for at least 12 hours  before your test. If you do not know if something contains caffeine, ask your health care professional. What side effects may I notice from receiving this medicine? Side effects that you should report to your doctor or health care professional as soon as possible:  allergic reactions like skin rash, itching or  hives, swelling of the face, lips, or tongue  breathing problems  chest pain, tightness or palpitations  severe headache Side effects that usually do not require medical attention (report to your doctor or health care professional if they continue or are bothersome):  flushing  headache  irritation or pain at site where injected  nausea, vomiting This list may not describe all possible side effects. Call your doctor for medical advice about side effects. You may report side effects to FDA at 1-800-FDA-1088. Where should I keep my medicine? This drug is given in a hospital or clinic and will not be stored at home. NOTE: This sheet is a summary. It may not cover all possible information. If you have questions about this medicine, talk to your doctor, pharmacist, or health care provider.  2020 Elsevier/Gold Standard (2007-11-04 15:08:13)  Cardiac Nuclear Scan A cardiac nuclear scan is a test that is done to check the flow of blood to your heart. It is done when you are resting and when you are exercising. The test looks for problems such as:  Not enough blood reaching a portion of the heart.  The heart muscle not working as it should. You may need this test if:  You have heart disease.  You have had lab results that are not normal.  You have had heart surgery or a balloon procedure to open up blocked arteries (angioplasty).  You have chest pain.  You have shortness of breath. In this test, a special dye (tracer) is put into your bloodstream. The tracer will travel to your heart. A camera will then take pictures of your heart to see how the tracer moves through your heart. This test is usually done at a hospital and takes 2-4 hours. Tell a doctor about:  Any allergies you have.  All medicines you are taking, including vitamins, herbs, eye drops, creams, and over-the-counter medicines.  Any problems you or family members have had with anesthetic medicines.  Any blood  disorders you have.  Any surgeries you have had.  Any medical conditions you have.  Whether you are pregnant or may be pregnant. What are the risks? Generally, this is a safe test. However, problems may occur, such as:  Serious chest pain and heart attack. This is only a risk if the stress portion of the test is done.  Rapid heartbeat.  A feeling of warmth in your chest. This feeling usually does not last long.  Allergic reaction to the tracer. What happens before the test?  Ask your doctor about changing or stopping your normal medicines. This is important.  Follow instructions from your doctor about what you cannot eat or drink.  Remove your jewelry on the day of the test. What happens during the test?  An IV tube will be inserted into one of your veins.  Your doctor will give you a small amount of tracer through the IV tube.  You will wait for 20-40 minutes while the tracer moves through your bloodstream.  Your heart will be monitored with an electrocardiogram (ECG).  You will lie down on an exam table.  Pictures of your heart will be taken for about 15-20 minutes.  You may   also have a stress test. For this test, one of these things may be done: ? You will be asked to exercise on a treadmill or a stationary bike. ? You will be given medicines that will make your heart work harder. This is done if you are unable to exercise.  When blood flow to your heart has peaked, a tracer will again be given through the IV tube.  After 20-40 minutes, you will get back on the exam table. More pictures will be taken of your heart.  Depending on the tracer that is used, more pictures may need to be taken 3-4 hours later.  Your IV tube will be removed when the test is over. The test may vary among doctors and hospitals. What happens after the test?  Ask your doctor: ? Whether you can return to your normal schedule, including diet, activities, and medicines. ? Whether you should  drink more fluids. This will help to remove the tracer from your body. Drink enough fluid to keep your pee (urine) pale yellow.  Ask your doctor, or the department that is doing the test: ? When will my results be ready? ? How will I get my results? Summary  A cardiac nuclear scan is a test that is done to check the flow of blood to your heart.  Tell your doctor whether you are pregnant or may be pregnant.  Before the test, ask your doctor about changing or stopping your normal medicines. This is important.  Ask your doctor whether you can return to your normal activities. You may be asked to drink more fluids. This information is not intended to replace advice given to you by your health care provider. Make sure you discuss any questions you have with your health care provider. Document Released: 08/20/2017 Document Revised: 06/26/2018 Document Reviewed: 08/20/2017 Elsevier Patient Education  2020 Elsevier Inc.  Echocardiogram An echocardiogram is a procedure that uses painless sound waves (ultrasound) to produce an image of the heart. Images from an echocardiogram can provide important information about:  Signs of coronary artery disease (CAD).  Aneurysm detection. An aneurysm is a weak or damaged part of an artery wall that bulges out from the normal force of blood pumping through the body.  Heart size and shape. Changes in the size or shape of the heart can be associated with certain conditions, including heart failure, aneurysm, and CAD.  Heart muscle function.  Heart valve function.  Signs of a past heart attack.  Fluid buildup around the heart.  Thickening of the heart muscle.  A tumor or infectious growth around the heart valves. Tell a health care provider about:  Any allergies you have.  All medicines you are taking, including vitamins, herbs, eye drops, creams, and over-the-counter medicines.  Any blood disorders you have.  Any surgeries you have had.  Any  medical conditions you have.  Whether you are pregnant or may be pregnant. What are the risks? Generally, this is a safe procedure. However, problems may occur, including:  Allergic reaction to dye (contrast) that may be used during the procedure. What happens before the procedure? No specific preparation is needed. You may eat and drink normally. What happens during the procedure?   An IV tube may be inserted into one of your veins.  You may receive contrast through this tube. A contrast is an injection that improves the quality of the pictures from your heart.  A gel will be applied to your chest.  A wand-like tool (transducer)   will be moved over your chest. The gel will help to transmit the sound waves from the transducer.  The sound waves will harmlessly bounce off of your heart to allow the heart images to be captured in real-time motion. The images will be recorded on a computer. The procedure may vary among health care providers and hospitals. What happens after the procedure?  You may return to your normal, everyday life, including diet, activities, and medicines, unless your health care provider tells you not to do that. Summary  An echocardiogram is a procedure that uses painless sound waves (ultrasound) to produce an image of the heart.  Images from an echocardiogram can provide important information about the size and shape of your heart, heart muscle function, heart valve function, and fluid buildup around your heart.  You do not need to do anything to prepare before this procedure. You may eat and drink normally.  After the echocardiogram is completed, you may return to your normal, everyday life, unless your health care provider tells you not to do that. This information is not intended to replace advice given to you by your health care provider. Make sure you discuss any questions you have with your health care provider. Document Released: 03/03/2000 Document  Revised: 06/27/2018 Document Reviewed: 04/08/2016 Elsevier Patient Education  2020 [employer].

## 2018-11-28 ENCOUNTER — Telehealth (HOSPITAL_COMMUNITY): Payer: Self-pay | Admitting: *Deleted

## 2018-11-28 NOTE — Telephone Encounter (Signed)
Patient given detailed instructions per Myocardial Perfusion Study Information Sheet for the test on 12/04/18. Patient notified to arrive 15 minutes early and that it is imperative to arrive on time for appointment to keep from having the test rescheduled.  If you need to cancel or reschedule your appointment, please call the office within 24 hours of your appointment. . Patient verbalized understanding. Sheral Pfahler Jacqueline    

## 2018-12-03 ENCOUNTER — Ambulatory Visit (INDEPENDENT_AMBULATORY_CARE_PROVIDER_SITE_OTHER): Payer: Medicaid Other

## 2018-12-03 ENCOUNTER — Other Ambulatory Visit: Payer: Self-pay

## 2018-12-03 DIAGNOSIS — I25118 Atherosclerotic heart disease of native coronary artery with other forms of angina pectoris: Secondary | ICD-10-CM

## 2018-12-03 DIAGNOSIS — R0609 Other forms of dyspnea: Secondary | ICD-10-CM | POA: Diagnosis not present

## 2018-12-03 NOTE — Progress Notes (Signed)
Complete echocardiogram has been performed.  Jimmy Nandana Krolikowski RDCS, RVT 

## 2018-12-04 ENCOUNTER — Ambulatory Visit (INDEPENDENT_AMBULATORY_CARE_PROVIDER_SITE_OTHER): Payer: Medicaid Other

## 2018-12-04 ENCOUNTER — Telehealth: Payer: Self-pay | Admitting: Emergency Medicine

## 2018-12-04 DIAGNOSIS — R0609 Other forms of dyspnea: Secondary | ICD-10-CM

## 2018-12-04 DIAGNOSIS — I25118 Atherosclerotic heart disease of native coronary artery with other forms of angina pectoris: Secondary | ICD-10-CM

## 2018-12-04 LAB — MYOCARDIAL PERFUSION IMAGING
LV dias vol: 105 mL (ref 62–150)
LV sys vol: 34 mL
Peak HR: 86 {beats}/min
Rest HR: 68 {beats}/min
SDS: 0
SRS: 1
SSS: 1
TID: 1.24

## 2018-12-04 MED ORDER — TECHNETIUM TC 99M TETROFOSMIN IV KIT
31.5000 | PACK | Freq: Once | INTRAVENOUS | Status: AC | PRN
  Administered 2018-12-04: 31.5 via INTRAVENOUS

## 2018-12-04 MED ORDER — TECHNETIUM TC 99M TETROFOSMIN IV KIT
9.8000 | PACK | Freq: Once | INTRAVENOUS | Status: AC | PRN
  Administered 2018-12-04: 9.8 via INTRAVENOUS

## 2018-12-04 MED ORDER — REGADENOSON 0.4 MG/5ML IV SOLN
0.4000 mg | Freq: Once | INTRAVENOUS | Status: AC
  Administered 2018-12-04: 0.4 mg via INTRAVENOUS

## 2018-12-04 NOTE — Telephone Encounter (Signed)
Left message for patient to return call regarding echocardiogram results  

## 2019-02-18 HISTORY — PX: CATARACT EXTRACTION: SUR2

## 2019-02-21 ENCOUNTER — Other Ambulatory Visit: Payer: Self-pay

## 2019-02-21 ENCOUNTER — Ambulatory Visit (INDEPENDENT_AMBULATORY_CARE_PROVIDER_SITE_OTHER): Payer: Medicaid Other | Admitting: Cardiology

## 2019-02-21 ENCOUNTER — Encounter: Payer: Self-pay | Admitting: Cardiology

## 2019-02-21 VITALS — BP 138/90 | HR 67 | Ht 73.0 in | Wt 276.0 lb

## 2019-02-21 DIAGNOSIS — R809 Proteinuria, unspecified: Secondary | ICD-10-CM

## 2019-02-21 DIAGNOSIS — I25118 Atherosclerotic heart disease of native coronary artery with other forms of angina pectoris: Secondary | ICD-10-CM | POA: Diagnosis not present

## 2019-02-21 DIAGNOSIS — E782 Mixed hyperlipidemia: Secondary | ICD-10-CM

## 2019-02-21 DIAGNOSIS — Z86711 Personal history of pulmonary embolism: Secondary | ICD-10-CM

## 2019-02-21 DIAGNOSIS — Z86718 Personal history of other venous thrombosis and embolism: Secondary | ICD-10-CM

## 2019-02-21 DIAGNOSIS — I82A29 Chronic embolism and thrombosis of unspecified axillary vein: Secondary | ICD-10-CM | POA: Diagnosis not present

## 2019-02-21 DIAGNOSIS — E1129 Type 2 diabetes mellitus with other diabetic kidney complication: Secondary | ICD-10-CM

## 2019-02-21 DIAGNOSIS — J42 Unspecified chronic bronchitis: Secondary | ICD-10-CM

## 2019-02-21 DIAGNOSIS — Z794 Long term (current) use of insulin: Secondary | ICD-10-CM

## 2019-02-21 MED ORDER — NITROGLYCERIN 0.4 MG SL SUBL: 0.4000 mg | SUBLINGUAL_TABLET | SUBLINGUAL | 4 refills | Status: DC | PRN

## 2019-02-21 NOTE — Progress Notes (Signed)
Cardiology Office Note:    Date:  02/21/2019   ID:  Roberto Sosa, DOB 01-04-1961, MRN [id number]  PCP:  Roberto Bass, MD  Cardiologist:  Roberto Brothers, MD   Referring MD: Roberto Bass, MD    ASSESSMENT:    1. Atherosclerosis of native coronary artery of native heart with stable angina pectoris (HCC)   2. Chronic deep vein thrombosis (DVT) of axillary vein, unspecified laterality (HCC)   3. Mixed hyperlipidemia   4. History of deep venous thrombosis   5. History of pulmonary embolism   6. Type 2 diabetes mellitus with microalbuminuria, with long-term current use of insulin (HCC)    PLAN:    In order of problems listed above:  1. Dyspnea on exertion: I discussed my findings with the patient at extensive length.  I think she will benefit from a detailed pulmonary evaluation.  We made him ambulate around and checked his oxygen saturation and it was greater than 90%. 2. Essential hypertension: Blood pressure stable 3. History of DVT and pulmonary embolism: Patient is on anticoagulation and takes it very religiously.  This is managed by primary care physician.  Benefits and risks of anticoagulation emphasized and he vocalized understanding. 4. Patient will be seen in follow-up appointment in 6 months or earlier if the patient has any concerns 5. Weight reduction was stressed and risks of obesity explained.  He plans to do better.   Medication Adjustments/Labs and Tests Ordered: Current medicines are reviewed at length with the patient today.  Concerns regarding medicines are outlined above.  No orders of the defined types were placed in this encounter.  No orders of the defined types were placed in this encounter.    Chief Complaint  Patient presents with  . Follow-up     History of Present Illness:    Roberto Sosa is a 58 y.o. male.  Patient has past medical history of coronary artery disease, essential hypertension, dyslipidemia and history of smoking.  He has  had significant DVT and pulmonary embolism for which she is on anticoagulation.  He complains of some shortness of breath on exertion.  No chest pain orthopnea or PND.  This has been going on for quite some time.  For this reason he underwent stress testing and echocardiogram which are largely unremarkable.  At the time of my evaluation, the patient is alert awake oriented and in no distress.  Past Medical History:  Diagnosis Date  . Atherosclerosis of native coronary artery of native heart with stable angina pectoris (HCC) 06/09/2015  . Coronary artery disease   . Diabetes mellitus without complication (HCC)   . DVT (deep venous thrombosis) (HCC) 03/07/2016  . Dyspnea 08/28/2017   2019: onset after PE  . High cholesterol   . History of deep venous thrombosis 03/07/2016   Overview:  2017: LLE, extensive, thrombectomy and 2 venous stents, no PE . Post-trauma. Xarelto  . History of pulmonary embolism 07/27/2017   2019: occurred off NOAC  . Left thigh pain 03/03/2016  . Mixed hyperlipidemia 06/09/2015  . Nephrolithiasis 03/14/2016  . Type 2 diabetes mellitus with microalbuminuria, with long-term current use of insulin (HCC) 06/09/2015   2018: UACR=200    Past Surgical History:  Procedure Laterality Date  . CARDIAC CATHETERIZATION Left 03/09/2016   Procedure: Intravascular Ultrasound/IVUS;  Surgeon: Roberto Hertz, MD;  Location: [facility] INVASIVE CV LAB;  Service: Cardiovascular;  Laterality: Left;  IVC-LT COMMON ILIAC VEIN-LT EXR ILIAC VEIN-LT DEEP FEMORAL/POPLITEAL VEIN LYSIS AND THROMBECTOMY  .  CARDIAC CATHETERIZATION Left 03/10/2016   Procedure: Intravascular Ultrasound/IVUS;  Surgeon: Roberto Dutch, MD;  Location: [place] CV LAB;  Service: Cardiovascular;  Laterality: Left;  Lower extremity venous.  Marland Kitchen CATARACT EXTRACTION Bilateral 02/2019  . HUMERUS FRACTURE SURGERY Left   . ORTHOPEDIC SURGERY Bilateral 06/2006   multiple fractures and breaks  . PERIPHERAL VASCULAR CATHETERIZATION Left  03/09/2016   Procedure: Lower Extremity Venography;  Surgeon: Roberto San Miguel, MD;  Location: [place] CV LAB;  Service: Cardiovascular;  Laterality: Left;  . PERIPHERAL VASCULAR CATHETERIZATION N/A 03/09/2016   Procedure: IVC Venography;  Surgeon: Roberto Petersburg, MD;  Location: [place] CV LAB;  Service: Cardiovascular;  Laterality: N/A;  . PERIPHERAL VASCULAR CATHETERIZATION Left 03/10/2016   Procedure: Lower Extremity Venography;  Surgeon: Roberto Dutch, MD;  Location: [place] CV LAB;  Service: Cardiovascular;  Laterality: Left;  . PERIPHERAL VASCULAR CATHETERIZATION Left 03/10/2016   Procedure: Peripheral Vascular Intervention;  Surgeon: Roberto Dutch, MD;  Location: [place] CV LAB;  Service: Cardiovascular;  Laterality: Left;  Left common and external iliac vein.    Marland Kitchen SHOULDER ARTHROSCOPY Right     Current Medications: Current Meds  Medication Sig  . carvedilol (COREG) 25 MG tablet TAKE 1 TABLET BY MOUTH EVERY 12 HOURS FOR HEART AND BLOOD PRESSURE.  Marland Kitchen ELIQUIS 5 MG TABS tablet Take by mouth.   . ezetimibe (ZETIA) 10 MG tablet Take 10 mg by mouth every morning.  . gabapentin (NEURONTIN) 600 MG tablet Take 600 mg by mouth 3 (three) times daily.   . Insulin Glargine-Lixisenatide 100-33 UNT-MCG/ML SOPN Inject into the skin.  Marland Kitchen insulin lispro (HUMALOG) 100 UNIT/ML injection Inject into the skin 3 (three) times daily before meals.  . metFORMIN (GLUCOPHAGE) 1000 MG tablet Take 1,000 mg by mouth.  . nitroGLYCERIN (NITROSTAT) 0.4 MG SL tablet Place 0.4 mg under the tongue.  . rosuvastatin (CRESTOR) 40 MG tablet Take 40 mg by mouth daily.     Allergies:   Patient has no known allergies.   Social History   Socioeconomic History  . Marital status: Widowed    Spouse name: Not on file  . Number of children: Not on file  . Years of education: Not on file  . Highest education level: Not on file  Occupational History  . 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 Financial resource strain: Not on file  . Food insecurity    Worry: Not on file    Inability: Not on file  . Transportation needs    Medical: Not on file    Non-medical: Not on file  Tobacco Use  . Smoking status: Former Smoker    Packs/day: 2.00    Years: 20.00    Pack years: 40.00    Types: Cigarettes, Pipe, Cigars    Quit date: 2001    Years since quitting: 19.9  . Smokeless tobacco: Current User    Types: Chew  Substance and Sexual Activity  . Alcohol use: Yes    Comment: 2 beers every 2 months  . Drug use: No  . Sexual activity: Not Currently  Lifestyle  . Physical activity    Days per week: Not on file    Minutes per session: Not on file  . Stress: Not on file  Relationships  . Social Herbalist on phone: Not on file    Gets together: Not on file  Attends religious service: Not on file    Active member of club or organization: Not on file    Attends meetings of clubs or organizations: Not on file    Relationship status: Not on file  Other Topics Concern  . Not on file  Social History Narrative  . Not on file     Family History: The patient's family history is not on file. He was adopted.  ROS:   Please see the history of present illness.    All other systems reviewed and are negative.  EKGs/Labs/Other Studies Reviewed:    The following studies were reviewed today: Study Highlights   The left ventricular ejection fraction is hyperdynamic (>65%).  Nuclear stress EF: 68%.  There was no ST segment deviation noted during stress.  The study is normal.  This is a low risk study.     IMPRESSIONS    1. The left ventricle has mildly reduced systolic function, with an ejection fraction of 45-50%. The cavity size was normal. There is mild concentric left ventricular hypertrophy. Left ventricular diastolic Doppler parameters are consistent with  pseudonormalization. No evidence of left  ventricular regional wall motion abnormalities.  2. There is abnormal LV global longitudinal strain (-9.2%).  3. Left atrial size was mildly dilated.  4. There is mild dilatation of the ascending aorta measuring 37 mm.  5. The right ventricle has normal systolic function. The cavity was normal. There is no increase in right ventricular wall thickness.  6. No evidence of mitral valve stenosis.  7. The tricuspid valve is grossly normal.  8. Trivial pericardial effusion is present.   Recent Labs: No results found for requested labs within last 8760 hours.  Recent Lipid Panel No results found for: CHOL, TRIG, HDL, CHOLHDL, VLDL, LDLCALC, LDLDIRECT  Physical Exam:    VS:  BP 138/90 (BP Location: Right Arm, Patient Position: Sitting, Cuff Size: Large)   Pulse 67   Ht 6\' 1"  (1.854 m)   Wt 276 lb (125.2 kg)   SpO2 99%   BMI 36.41 kg/m     Wt Readings from Last 3 Encounters:  02/21/19 276 lb (125.2 kg)  12/04/18 270 lb (122.5 kg)  11/22/18 270 lb (122.5 kg)     GEN: Patient is in no acute distress HEENT: Normal NECK: No JVD; No carotid bruits LYMPHATICS: No lymphadenopathy CARDIAC: Hear sounds regular, 2/6 systolic murmur at the apex. RESPIRATORY:  Clear to auscultation without rales, wheezing or rhonchi  ABDOMEN: Soft, non-tender, non-distended MUSCULOSKELETAL:  No edema; No deformity  SKIN: Warm and dry NEUROLOGIC:  Alert and oriented x 3 PSYCHIATRIC:  Normal affect   Signed, Roberto Brothersajan R Revankar, MD  02/21/2019 9:50 AM    [facility]

## 2019-02-21 NOTE — Patient Instructions (Signed)
Medication Instructions:  Your physician recommends that you continue on your current medications as directed. Please refer to the Current Medication list given to you today.  *If you need a refill on your cardiac medications before your next appointment, please call your pharmacy*  Lab Work: NONE If you have labs (blood work) drawn today and your tests are completely normal, you will receive your results only by: Marland Kitchen MyChart Message (if you have MyChart) OR . A paper copy in the mail If you have any lab test that is abnormal or we need to change your treatment, we will call you to review the results.  Testing/Procedures: You had an EKG performed today  You are being referred to [facility] pulmonology. You will be called to schedule an evaluation  Follow-Up: At [facility], you and your health needs are our priority.  As part of our continuing mission to provide you with exceptional heart care, we have created designated Provider Care Teams.  These Care Teams include your primary Cardiologist (physician) and Advanced Practice Providers (APPs -  Physician Assistants and Nurse Practitioners) who all work together to provide you with the care you need, when you need it.  Your next appointment:   6 month(s)  The format for your next appointment:   In Person  Provider:   Jyl Heinz, MD

## 2019-02-21 NOTE — Progress Notes (Signed)
Patient was ambulated around the building with pulse ox to monitor his saturations. SPO2 stayed between 97-98% HR initially 64 and increased to 86. Patient complained of dizziness/fatigue. RN allowed him to rest prior to discharge and pulmonology referral placed.

## 2019-03-10 ENCOUNTER — Ambulatory Visit: Payer: Medicaid Other | Admitting: Pulmonary Disease

## 2019-03-10 ENCOUNTER — Encounter: Payer: Self-pay | Admitting: Pulmonary Disease

## 2019-03-10 ENCOUNTER — Other Ambulatory Visit: Payer: Self-pay

## 2019-03-10 VITALS — BP 132/70 | HR 76 | Temp 97.8°F | Ht 73.0 in | Wt 270.0 lb

## 2019-03-10 DIAGNOSIS — J4540 Moderate persistent asthma, uncomplicated: Secondary | ICD-10-CM

## 2019-03-10 DIAGNOSIS — R0602 Shortness of breath: Secondary | ICD-10-CM

## 2019-03-10 NOTE — Progress Notes (Signed)
Subjective:   PATIENT ID: Roberto Sosa GENDER: male DOB: 1961-01-07, MRN: [id number]   HPI  Chief Complaint  Patient presents with  . Consult    referred by cardiology.  hx PE in 2019, has had difficulty with breathing since.     Reason for Visit: New consult for shortness of breath  Mr. Roberto RenJeffrey Sosa is a 58 year old male with hx of recurrent DVT/PE and currently on Xarelto who presents as a new consult for persistent dyspnea.  He reports shortness of breath since 07/2017 after his diagnosis for PE. He is on chronic anticoagulation which is managed by his PCP. He is compliant with his medication. His dyspnea is most notable with exertion. He is unable to walk short distances including when getting his mail. He no longer mows his lawn or weed eat due to symptoms. He rarely wheezes and does not feel this is associated with his dyspnea. He has previously tried inhalers describing a short acting inhaler and a long acting inhaler but he does not know the names. Rest improves his symptoms. He is not aware of any other triggers. Denies fevers, chills, cough, chest pain, palpitations, or lower extremity swelling. He has previously smoked and reports a 40 pack year history.  Social History: Quit in 1992. 40 pack year history  I have personally reviewed patient's past medical/family/social history, allergies, current medications.  Past Medical History:  Diagnosis Date  . Atherosclerosis of native coronary artery of native heart with stable angina pectoris (HCC) 06/09/2015  . Coronary artery disease   . Diabetes mellitus without complication (HCC)   . DVT (deep venous thrombosis) (HCC) 03/07/2016  . Dyspnea 08/28/2017   2019: onset after PE  . High cholesterol   . History of deep venous thrombosis 03/07/2016   Overview:  2017: LLE, extensive, thrombectomy and 2 venous stents, no PE . Post-trauma. Xarelto  . History of pulmonary embolism 07/27/2017   2019: occurred off NOAC  . Left thigh  pain 03/03/2016  . Mixed hyperlipidemia 06/09/2015  . Nephrolithiasis 03/14/2016  . Type 2 diabetes mellitus with microalbuminuria, with long-term current use of insulin (HCC) 06/09/2015   2018: UACR=200     Family History  Adopted: Yes     Social History   Occupational History  . Occupation: "Human resources officerloater"    Employer: [employer]    Comment: Media plannerMachinery operator  Tobacco Use  . Smoking status: Former Smoker    Packs/day: 2.00    Years: 20.00    Pack years: 40.00    Types: Cigarettes, Pipe, Cigars    Quit date: 2001    Years since quitting: 19.9  . Smokeless tobacco: Current User    Types: Chew  Substance and Sexual Activity  . Alcohol use: Yes    Comment: 2 beers every 2 months  . Drug use: No  . Sexual activity: Not Currently    No Known Allergies   Outpatient Medications Prior to Visit  Medication Sig Dispense Refill  . carvedilol (COREG) 25 MG tablet TAKE 1 TABLET BY MOUTH EVERY 12 HOURS FOR HEART AND BLOOD PRESSURE.    Marland Kitchen. ELIQUIS 5 MG TABS tablet Take by mouth.   1  . ezetimibe (ZETIA) 10 MG tablet Take 10 mg by mouth every morning.    . gabapentin (NEURONTIN) 600 MG tablet Take 600 mg by mouth 3 (three) times daily.     . Insulin Glargine-Lixisenatide 100-33 UNT-MCG/ML SOPN Inject into the skin.    Marland Kitchen. insulin lispro (HUMALOG) 100 UNIT/ML  injection Inject into the skin 3 (three) times daily before meals.    . metFORMIN (GLUCOPHAGE) 1000 MG tablet Take 1,000 mg by mouth.    . nitroGLYCERIN (NITROSTAT) 0.4 MG SL tablet Place 1 tablet (0.4 mg total) under the tongue every 5 (five) minutes as needed for chest pain. 30 tablet 4  . rosuvastatin (CRESTOR) 40 MG tablet Take 40 mg by mouth daily.     No facility-administered medications prior to visit.    Review of Systems  Constitutional: Negative for chills, diaphoresis, fever, malaise/fatigue and weight loss.  HENT: Negative for congestion, ear pain and sore throat.   Respiratory: Positive for shortness of breath. Negative  for cough, hemoptysis, sputum production and wheezing.   Cardiovascular: Negative for chest pain, palpitations and leg swelling.  Gastrointestinal: Negative for abdominal pain, heartburn and nausea.  Genitourinary: Negative for frequency.  Musculoskeletal: Negative for joint pain and myalgias.  Skin: Negative for itching and rash.  Neurological: Negative for dizziness, weakness and headaches.  Endo/Heme/Allergies: Does not bruise/bleed easily.  Psychiatric/Behavioral: Negative for depression. The patient is not nervous/anxious.      Objective:   Vitals:   03/10/19 1624  BP: 132/70  Pulse: 76  Temp: 97.8 F (36.6 C)  TempSrc: Temporal  SpO2: 98%  Weight: 270 lb (122.5 kg)  Height: 6\' 1"  (1.854 m)      Physical Exam: General: Well-appearing, no acute distress HENT: , AT Eyes: EOMI, no scleral icterus Respiratory: Clear to auscultation bilaterally.  No crackles, wheezing or rales Cardiovascular: RRR, -M/R/G, no JVD GI: BS+, soft, nontender Extremities:-Edema,-tenderness Neuro: AAO x4, CNII-XII grossly intact Skin: Intact, no rashes or bruising Psych: Normal mood, normal affect  Data Reviewed:  Imaging: None on file  PFT: None on file  Labs: CBC    Component Value Date/Time   WBC 9.8 03/11/2016 0340   RBC 4.67 03/11/2016 0340   HGB 13.3 03/11/2016 0340   HCT 38.7 (L) 03/11/2016 0340   PLT 146 (L) 03/11/2016 0340   MCV 82.9 03/11/2016 0340   MCH 28.5 03/11/2016 0340   MCHC 34.4 03/11/2016 0340   RDW 13.3 03/11/2016 0340   LYMPHSABS 2.7 03/06/2016 1742   MONOABS 0.5 03/06/2016 1742   EOSABS 0.3 03/06/2016 1742   BASOSABS 0.0 03/06/2016 1742   BMET    Component Value Date/Time   NA 139 03/11/2016 0340   K 3.6 03/11/2016 0340   CL 106 03/11/2016 0340   CO2 21 (L) 03/11/2016 0340   GLUCOSE 267 (H) 03/11/2016 0340   BUN 13 03/11/2016 0340   CREATININE 1.73 (H) 03/11/2016 0340   CALCIUM 8.3 (L) 03/11/2016 0340   GFRNONAA 43 (L) 03/11/2016 0340    GFRAA 49 (L) 03/11/2016 0340   Imaging, labs and tests noted above have been reviewed independently by me.    Assessment & Plan:   Discussion: 58 year old male former smoker with dyspnea on exertion for over a year that has severely limited his activity in the last several months. Has had cardiac work-up including stress test that is unrevealing. He denies childhood history of respiratory illnesses or asthma. Etiology remains unclear. Will obtain PFT records to rule out underlying lung disease for initial work-up.   Shortness of breath --START Albuterol as needed for shortness of breath. This is your RESCUE inhaler. Use AS NEEDED  We will obtain records of your prior pulmonary function tests. If unable to obtain, will scheduled PFTs here.  Health Maintenance Immunization History  Administered Date(s) Administered  .  Influenza,inj,Quad PF,6+ Mos 05/06/2018  . Influenza-Unspecified 07/27/2017  . Tdap 03/20/2010   CT Lung Screen - does not qualify. Quit smoking >15 years ago   No orders of the defined types were placed in this encounter.  Meds ordered this encounter  Medications  . fluticasone-salmeterol (ADVAIR HFA) 115-21 MCG/ACT inhaler    Sig: Inhale 2 puffs into the lungs 2 (two) times daily.    Dispense:  1 Inhaler    Refill:  12  . albuterol (VENTOLIN HFA) 108 (90 Base) MCG/ACT inhaler    Sig: Inhale 1-2 puffs into the lungs every 4 (four) hours as needed for wheezing or shortness of breath.    Dispense:  8 g    Refill:  3    Return in about 8 weeks (around 05/05/2019).  Jessicia Napolitano Rodman Pickle, MD [place] [facility] 03/10/2019 12:19 PM  Office Number [phone]  ADDENDUM 03/20/19: PFTs  08/04/17 FVC 3.07 (57%) FEV1 2.48 (60%) Ratio 83  TLC 97% RV 177% DLCO 71%. Significant bronchodilator response in FEV1 14% Interpretation: Normal spirometry. Significant bronchodilator response present. Air trapping present. Mildly reduced DLCO. This may suggest asthma.  I  contacted the patient for the following inhaler changes. Recommend Advair 115-21 mcg TWO puffs TWICE a day. This is your EVERYDAY inhaler. Ordered Albuterol inhaler as needed. This is your RESCUE inhaler.  Also discussed COVID 19 precautions including hand washing, masking and hand washing.  Rodman Pickle, M.D. [facility] 03/20/2019 12:23 PM

## 2019-03-10 NOTE — Patient Instructions (Addendum)
Shortness of breath --START Albuterol as needed for shortness of breath. This is your RESCUE inhaler. Use AS NEEDED  We will obtain records of your prior pulmonary function tests. If unable to obtain, will scheduled PFTs here.  Follow-up with me in 8 weeks.

## 2019-03-13 ENCOUNTER — Telehealth: Payer: Self-pay | Admitting: Pulmonary Disease

## 2019-03-13 NOTE — Telephone Encounter (Signed)
Rec'd from [facility] forwarded 5 pages to Dr. Margaretha Seeds

## 2019-03-20 DIAGNOSIS — J4540 Moderate persistent asthma, uncomplicated: Secondary | ICD-10-CM

## 2019-03-20 HISTORY — DX: Moderate persistent asthma, uncomplicated: J45.40

## 2019-03-20 MED ORDER — ALBUTEROL SULFATE HFA 108 (90 BASE) MCG/ACT IN AERS: 1.0000 | INHALATION_SPRAY | RESPIRATORY_TRACT | 3 refills | Status: DC | PRN

## 2019-03-20 MED ORDER — ADVAIR HFA 115-21 MCG/ACT IN AERO: 2.0000 | INHALATION_SPRAY | Freq: Two times a day (BID) | RESPIRATORY_TRACT | 12 refills | Status: DC

## 2019-03-24 ENCOUNTER — Telehealth: Payer: Self-pay | Admitting: Pulmonary Disease

## 2019-03-24 NOTE — Telephone Encounter (Signed)
Spoke with the pt  He states that the pharmacy did not have his advair hfa  Called [facility] in [place] with pharmacist   Insurance needs [place] (Medicaid)- call [phone]  Pt ID number [id number] Case ID number [id number]  Preferred alternatives are advair diskus, dulera and symbicort  Will forward to Dr Everardo All to see if she wants to change to covered alternative, thanks!

## 2019-03-25 MED ORDER — BUDESONIDE-FORMOTEROL FUMARATE 160-4.5 MCG/ACT IN AERO: 2.0000 | INHALATION_SPRAY | Freq: Two times a day (BID) | RESPIRATORY_TRACT | 2 refills | Status: DC

## 2019-03-25 NOTE — Telephone Encounter (Signed)
Prescribe patient Symbicort 160-4.5 mcg 2 puffs twice a day as an alternative to Advair. Let patient know of change.

## 2019-03-25 NOTE — Telephone Encounter (Signed)
I spoke with pt, he agreed to change to Symbicort. I sent in Rx to [facility]. Nothing further is needed.

## 2019-05-06 ENCOUNTER — Other Ambulatory Visit: Payer: Self-pay

## 2019-05-06 ENCOUNTER — Ambulatory Visit (INDEPENDENT_AMBULATORY_CARE_PROVIDER_SITE_OTHER): Payer: Medicare Other | Admitting: Primary Care

## 2019-05-06 ENCOUNTER — Ambulatory Visit: Payer: PRIVATE HEALTH INSURANCE | Admitting: Pulmonary Disease

## 2019-05-06 ENCOUNTER — Encounter: Payer: Self-pay | Admitting: Primary Care

## 2019-05-06 DIAGNOSIS — R809 Proteinuria, unspecified: Secondary | ICD-10-CM

## 2019-05-06 DIAGNOSIS — Z86711 Personal history of pulmonary embolism: Secondary | ICD-10-CM

## 2019-05-06 DIAGNOSIS — E1129 Type 2 diabetes mellitus with other diabetic kidney complication: Secondary | ICD-10-CM | POA: Diagnosis not present

## 2019-05-06 DIAGNOSIS — Z794 Long term (current) use of insulin: Secondary | ICD-10-CM

## 2019-05-06 DIAGNOSIS — J4540 Moderate persistent asthma, uncomplicated: Secondary | ICD-10-CM | POA: Diagnosis not present

## 2019-05-06 NOTE — Progress Notes (Signed)
@Patient  ID: , male    DOB: 1961/01/28, 59 y.o.   MRN: [id number]  Chief Complaint  Patient presents with  . Follow-up    Shortness of breath, Moderate persistent asthma without complication.    Referring provider: [id number], MD  HPI: 59 year old male, former smoker quit in 1992 (40 pack year hx). 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 Patient of Dr. 2020, seen for initial consult on 03/10/19 for dyspnea. Referred by cardiology. Patient has had shortness of breath since being diagnosed with pulmonary emboli in 2019. Cardiac work-up including stress test which was unrevealing. PFTs consistent with asthma, he had a significant bronchodilator response. Started on Symbicort 160 two puffs twice daily and prn albuterol hfa.   05/06/2019 Patient presents today for 2 month follow-up. Reports no noticeable improvement with Symbicort 160. Feels some improvement with Albuterol rescue inhaler. He has noticed less chest tightness and wheezing in the last 8-10 days.  Continues to experience dyspnea with exertion. States that he becomes easily winded walking to the mailbox or when speaking. He has a cough with clear mucus production. He signed up at [employer] but states that the work out was too intense for him.   Cardiac: 12/03/18 Echocardiogram- EF 45-50%, mildly reduced systolic function, mild LVH, abnormal LV longitudinal strain, left atrial size mildly dilated.   Pulmonary function testing: 08/04/17- FVC 3.07 (57%) FEV1 2.48 (60%) Ratio 83  TLC 97% RV 177% DLCO 71%. Significant bronchodilator response in FEV1 14% Interpretation: Normal spirometry. Significant bronchodilator response present. Air trapping present. Mildly reduced DLCO. This may suggest asthma.  No Known Allergies  Immunization History  Administered Date(s) Administered  . Influenza,inj,Quad PF,6+ Mos 05/06/2018  . Influenza-Unspecified 07/27/2017  . Tdap 03/20/2010    Past  Medical History:  Diagnosis Date  . Atherosclerosis of native coronary artery of native heart with stable angina pectoris (HCC) 06/09/2015  . Coronary artery disease   . Diabetes mellitus without complication (HCC)   . DVT (deep venous thrombosis) (HCC) 03/07/2016  . Dyspnea 08/28/2017   2019: onset after PE  . High cholesterol   . History of deep venous thrombosis 03/07/2016   Overview:  2017: LLE, extensive, thrombectomy and 2 venous stents, no PE . Post-trauma. Xarelto  . History of pulmonary embolism 07/27/2017   2019: occurred off NOAC  . Left thigh pain 03/03/2016  . Mixed hyperlipidemia 06/09/2015  . Nephrolithiasis 03/14/2016  . Type 2 diabetes mellitus with microalbuminuria, with long-term current use of insulin (HCC) 06/09/2015   2018: UACR=200    Tobacco History: Social History   Tobacco Use  Smoking Status Former Smoker  . Packs/day: 2.00  . Years: 20.00  . Pack years: 40.00  . Types: Cigarettes, Pipe, Cigars  . Quit date: 03/20/1990  . Years since quitting: 29.1  Smokeless Tobacco Current User  . Types: Chew   Ready to quit: Not Answered Counseling given: Not Answered   Outpatient Medications Prior to Visit  Medication Sig Dispense Refill  . budesonide-formoterol (SYMBICORT) 160-4.5 MCG/ACT inhaler Inhale 2 puffs into the lungs 2 (two) times daily. 1 Inhaler 2  . carvedilol (COREG) 25 MG tablet TAKE 1 TABLET BY MOUTH EVERY 12 HOURS FOR HEART AND BLOOD PRESSURE.    . Dulaglutide (TRULICITY) 1.5 MG/0.5ML SOPN once a week.     05/19/1990 ELIQUIS 5 MG TABS tablet Take 5 mg by mouth 2 (two) times daily.   1  . ezetimibe (ZETIA) 10 MG tablet Take 10  mg by mouth every morning.    . gabapentin (NEURONTIN) 600 MG tablet Take 600 mg by mouth every morning.     . Insulin Degludec (TRESIBA FLEXTOUCH) 200 UNIT/ML SOPN 70 Units daily.     . metFORMIN (GLUCOPHAGE) 1000 MG tablet Take 500 mg by mouth 2 (two) times daily with a meal.     . nitroGLYCERIN (NITROSTAT) 0.4 MG SL tablet Place  1 tablet (0.4 mg total) under the tongue every 5 (five) minutes as needed for chest pain. 30 tablet 4  . rosuvastatin (CRESTOR) 40 MG tablet Take 40 mg by mouth every evening.     Marland Kitchen albuterol (VENTOLIN HFA) 108 (90 Base) MCG/ACT inhaler Inhale 1-2 puffs into the lungs every 4 (four) hours as needed for wheezing or shortness of breath. 8 g 3  . Insulin Glargine-Lixisenatide 100-33 UNT-MCG/ML SOPN Inject 60 Units into the skin at bedtime.     . insulin lispro (HUMALOG) 100 UNIT/ML injection Inject 5 Units into the skin 3 (three) times daily before meals.      No facility-administered medications prior to visit.   Review of Systems  Review of Systems  Constitutional: Negative.   Respiratory: Positive for cough and shortness of breath. Negative for chest tightness and wheezing.   Cardiovascular: Negative for chest pain and leg swelling.   Physical Exam  BP 130/76 (BP Location: Left Arm, Patient Position: Sitting, Cuff Size: Large)   Pulse 86   Temp 98.2 F (36.8 C)   Ht 6\' 1"  (1.854 m)   Wt 261 lb 6.4 oz (118.6 kg)   SpO2 99% Comment: on room air  BMI 34.49 kg/m  Physical Exam Constitutional:      General: He is not in acute distress.    Appearance: Normal appearance. He is obese. He is not ill-appearing.  HENT:     Head: Normocephalic and atraumatic.     Mouth/Throat:     Mouth: Mucous membranes are moist.     Pharynx: Oropharynx is clear.  Cardiovascular:     Rate and Rhythm: Normal rate and regular rhythm.  Pulmonary:     Effort: Pulmonary effort is normal.     Breath sounds: Normal breath sounds. No wheezing or rhonchi.     Comments: Mild dyspnea with speaking Musculoskeletal:        General: Normal range of motion.  Skin:    General: Skin is warm and dry.  Neurological:     General: No focal deficit present.     Mental Status: He is alert and oriented to person, place, and time. Mental status is at baseline.  Psychiatric:        Mood and Affect: Mood normal.         Behavior: Behavior normal.        Thought Content: Thought content normal.        Judgment: Judgment normal.      Lab Results:  CBC    Component Value Date/Time   WBC 9.8 03/11/2016 0340   RBC 4.67 03/11/2016 0340   HGB 13.3 03/11/2016 0340   HCT 38.7 (L) 03/11/2016 0340   PLT 146 (L) 03/11/2016 0340   MCV 82.9 03/11/2016 0340   MCH 28.5 03/11/2016 0340   MCHC 34.4 03/11/2016 0340   RDW 13.3 03/11/2016 0340   LYMPHSABS 2.7 03/06/2016 1742   MONOABS 0.5 03/06/2016 1742   EOSABS 0.3 03/06/2016 1742   BASOSABS 0.0 03/06/2016 1742    BMET    Component Value Date/Time  NA 139 03/11/2016 0340   K 3.6 03/11/2016 0340   CL 106 03/11/2016 0340   CO2 21 (L) 03/11/2016 0340   GLUCOSE 267 (H) 03/11/2016 0340   BUN 13 03/11/2016 0340   CREATININE 1.73 (H) 03/11/2016 0340   CALCIUM 8.3 (L) 03/11/2016 0340   GFRNONAA 43 (L) 03/11/2016 0340   GFRAA 49 (L) 03/11/2016 0340    BNP No results found for: BNP  ProBNP No results found for: PROBNP  Imaging: No results found.   Assessment & Plan:   Moderate persistent asthma without complication - Continues to have exertional dyspnea, however, he is experiencing less chest tightness and wheezing  - Adding spacer to be used with HFA  - Continue Symbicot 160 two puffs twice daily (use with spacer) - Continue Albuterol rescue inhaler 2 puffs every 6 hours for breakthrough shortness of breath  - Encourage regular physical exercise (silver sneakers or pulmonary rehab recommended)   Type 2 diabetes mellitus with microalbuminuria, with long-term current use of insulin (HCC) - Started on Tresiba and reports improvement in glucose readings (around 150)   History of pulmonary embolism - Recurrent DVT/PE in 2019 - Continues Eliquis 5mg  twice daily     Martyn Ehrich, NP 05/06/2019

## 2019-05-06 NOTE — Patient Instructions (Addendum)
Asthma: - Continue Symbicot two puffs twice daily (use with spacer) - Continue Albuterol rescue inhaler 2 puffs every 6 hours for breakthrough shortness of breath  - Encourage you get some regular physical exercise   Exercise:  - Look into Silver sneakers- for exercise - Or we can refer you to pulmonary rehab program   Follow-up: - 3-4 months    Asthma and Physical Activity Physical activity is an important part of a healthy lifestyle. If you have asthma, it is important to exercise because physical activity can help you to:  Control your asthma.  Maintain your weight or lose weight.  Increase your energy.  Decrease stress and anxiety.  Lower your risk of getting sick.  Improve your heart health. However, asthma symptoms can flare up when you are physically active or exercising. You can learn how to control your asthma and prevent symptoms during exercise. This will help you to remain physically active. How can asthma affect my ability to be physically active? When you have asthma, physical activity can cause you to have symptoms such as:  Wheezing. This may sound like whistling while breathing.  A feeling of tightness in the chest.  Sore throat.  Coughing.  Shortness of breath.  Tiredness (fatigue) with minimal activity.  Increased sputum production.  Chest pain. What actions can I take to prevent asthma problems during physical activity? Pulmonary rehabilitation Enroll in a pulmonary rehabilitation program. Benefits of this type of program include:  Education on lung diseases.  Classes that teach you how to exercise and be more active while decreasing your shortness of breath.  A group setting that allows you to talk with others who have asthma. Asthma action plan Follow the asthma action plan set by your health care provider. Your personal asthma plan may include:  Taking your medicines as told by your health care provider.  Avoiding your asthma  triggers, except physical activity. Triggers may include cold air, dust, pollen, pet dander, and air pollution.  Tracking your asthma control.  Using a peak flow meter.  Being aware of worsening symptoms.  Knowing when to seek emergency care. Proper breathing During exercise, follow these tips for proper breathing:  Breathe in before starting the exercise and breathe out during the hardest part of the exercise.  Take slow breaths.  Pace yourself and do not try to go too fast.  While breathing out, purse your lips. Before beginning any exercise program or new activity, talk with your health care provider. Medicines If physical activity triggers your asthma, your health care provider may order the following medicines:  A rescue inhaler (short-acting beta2-agonist) for you to use shortly before physical activity or exercise. Its effects may reduce exercise-related symptoms for 2-3 hours.  A long-acting beta2-agonist that can offer up to 12 hours of relief if taken daily.  Leukotriene modifiers. These pills are taken several hours before physical activity or exercise to help prevent asthma symptoms that are caused by exercise.  Long-term control medicines. These will be given if you have severe or frequent asthma symptoms during or after exercise. These symptoms may also mean that your asthma is not well controlled.  General information  Exercise indoors when the air is dry or during allergy season.  Try to breathe in warm, moist air by wearing a scarf over your nose and mouth or breathing only through your nose.  Spend a few minutes warming up before your workout.  Cool down after exercise. What should I do if my asthma  symptoms get worse? Contact your health care provider if your asthma symptoms are getting worse. Your asthma is getting worse if:  You have symptoms more often.  Your symptoms are more severe.  Your symptoms get worse at night and make you lose  sleep.  Your peak flow number is lower than your personal best or changes a lot from day to day.  Your asthma medicines do not work as well as they used to.  You use your rescue inhaler more often. If you use your rescue inhaler more than 2 days a week, your asthma is not well controlled.  You go to the emergency room or see your health care provider because of an asthma attack. Where to find support  Ask your health care provider about signing up for a pulmonary rehabilitation program.  Ask your health care provider about asthma support groups.  Visit your local community health department.  Check out local hospitals' community health programs. Where can I get more information?  Your health care provider.  American Lung Association: lung.org  National Heart, Lung, and Blood Institute: https://www.hartman-hill.biz/ Contact a health care provider if:  You have trouble walking and talking because you are out of breath. Get help right away if:  Your lips or fingernails are blue.  You are not able to breathe or catch your breath. Summary  Physical activity is an important part of a healthy lifestyle. However, if you have asthma, your symptoms can flare up during exercise or physical activity.  You can prevent problems during physical activity by doing pulmonary rehabilitation, following an asthma action plan, doing proper breathing, and using medicines.  Talk with your health care provider before starting any exercise program or new activity. This information is not intended to replace advice given to you by your health care provider. Make sure you discuss any questions you have with your health care provider. Document Revised: 06/25/2018 Document Reviewed: 05/22/2017 Elsevier Patient Education  [place].

## 2019-05-06 NOTE — Assessment & Plan Note (Addendum)
-   Continues to have exertional dyspnea, however, he is experiencing less chest tightness and wheezing  - Adding spacer to be used with HFA  - Continue Symbicot 160 two puffs twice daily (use with spacer) - Continue Albuterol rescue inhaler 2 puffs every 6 hours for breakthrough shortness of breath  - Encourage regular physical exercise (silver sneakers or pulmonary rehab recommended)

## 2019-05-06 NOTE — Assessment & Plan Note (Addendum)
-   Recurrent DVT/PE in 2019 - Continues Eliquis 5mg  twice daily

## 2019-05-06 NOTE — Assessment & Plan Note (Signed)
-   Started on Tresiba and reports improvement in glucose readings (around 150)

## 2019-06-26 ENCOUNTER — Other Ambulatory Visit: Payer: Self-pay | Admitting: *Deleted

## 2019-06-26 DIAGNOSIS — I82422 Acute embolism and thrombosis of left iliac vein: Secondary | ICD-10-CM

## 2019-06-26 DIAGNOSIS — Z86718 Personal history of other venous thrombosis and embolism: Secondary | ICD-10-CM

## 2019-07-01 ENCOUNTER — Telehealth (HOSPITAL_COMMUNITY): Payer: Self-pay

## 2019-07-01 NOTE — Telephone Encounter (Signed)

## 2019-07-02 ENCOUNTER — Ambulatory Visit (INDEPENDENT_AMBULATORY_CARE_PROVIDER_SITE_OTHER): Payer: Medicare Other | Admitting: Physician Assistant

## 2019-07-02 ENCOUNTER — Other Ambulatory Visit: Payer: Self-pay

## 2019-07-02 ENCOUNTER — Ambulatory Visit (HOSPITAL_COMMUNITY)
Admission: RE | Admit: 2019-07-02 | Discharge: 2019-07-02 | Disposition: A | Discharge status: Home / Self Care | Payer: Medicare Other | Source: Ambulatory Visit | Attending: Vascular Surgery | Admitting: Vascular Surgery

## 2019-07-02 VITALS — BP 118/70 | HR 78 | Temp 97.0°F | Resp 20 | Ht 73.0 in | Wt 271.7 lb

## 2019-07-02 DIAGNOSIS — I82422 Acute embolism and thrombosis of left iliac vein: Secondary | ICD-10-CM | POA: Insufficient documentation

## 2019-07-02 DIAGNOSIS — Z86711 Personal history of pulmonary embolism: Secondary | ICD-10-CM | POA: Diagnosis not present

## 2019-07-02 DIAGNOSIS — Z720 Tobacco use: Secondary | ICD-10-CM

## 2019-07-02 DIAGNOSIS — Z86718 Personal history of other venous thrombosis and embolism: Secondary | ICD-10-CM

## 2019-07-02 NOTE — Progress Notes (Signed)
Office Note     CC:  follow up Requesting Provider:  Olive Bass, MD  HPI: Roberto Sosa is a 59 y.o. (Jan 02, 1961) male who presents for routine follow-up of left iliac-left lower extremity DVT diagnosed in 2017.  He underwent placement of left common iliac vein and external iliac vein stenting in December 2017 by Dr. Darrick Penna.  He denies lower extremity swelling or pain.  Activity is limited by chronic dyspnea.  His medical history is significant also for pulmonary embolism, diabetes mellitus insulin requiring and coronary artery disease. AMI treated medically in 2015. No history of stroke/TIA.  The pt is on a statin for cholesterol management. And Zetia The pt is not on a daily aspirin.   Other AC:  apixaban The pt is not on med for hypertension.   The pt is diabetic.  Trulicity, Evaristo Bury and metformin Tobacco hx:  Former smoker; uses smokeless tob  Past Medical History:  Diagnosis Date  . Atherosclerosis of native coronary artery of native heart with stable angina pectoris (HCC) 06/09/2015  . Coronary artery disease   . Diabetes mellitus without complication (HCC)   . DVT (deep venous thrombosis) (HCC) 03/07/2016  . Dyspnea 08/28/2017   2019: onset after PE  . High cholesterol   . History of deep venous thrombosis 03/07/2016   Overview:  2017: LLE, extensive, thrombectomy and 2 venous stents, no PE . Post-trauma. Xarelto  . History of pulmonary embolism 07/27/2017   2019: occurred off NOAC  . Left thigh pain 03/03/2016  . Mixed hyperlipidemia 06/09/2015  . Nephrolithiasis 03/14/2016  . Type 2 diabetes mellitus with microalbuminuria, with long-term current use of insulin (HCC) 06/09/2015   2018: UACR=200    Past Surgical History:  Procedure Laterality Date  . CARDIAC CATHETERIZATION Left 03/09/2016   Procedure: Intravascular Ultrasound/IVUS;  Surgeon: Fransisco Hertz, MD;  Location: [facility] INVASIVE CV LAB;  Service: Cardiovascular;  Laterality: Left;  IVC-LT COMMON ILIAC VEIN-LT EXR  ILIAC VEIN-LT DEEP FEMORAL/POPLITEAL VEIN LYSIS AND THROMBECTOMY  . CARDIAC CATHETERIZATION Left 03/10/2016   Procedure: Intravascular Ultrasound/IVUS;  Surgeon: Sherren Kerns, MD;  Location: [facility] INVASIVE CV LAB;  Service: Cardiovascular;  Laterality: Left;  Lower extremity venous.  Marland Kitchen CATARACT EXTRACTION Bilateral 02/2019  . HUMERUS FRACTURE SURGERY Left   . ORTHOPEDIC SURGERY Bilateral 06/2006   multiple fractures and breaks  . PERIPHERAL VASCULAR CATHETERIZATION Left 03/09/2016   Procedure: Lower Extremity Venography;  Surgeon: Fransisco Hertz, MD;  Location: [facility] INVASIVE CV LAB;  Service: Cardiovascular;  Laterality: Left;  . PERIPHERAL VASCULAR CATHETERIZATION N/A 03/09/2016   Procedure: IVC Venography;  Surgeon: Fransisco Hertz, MD;  Location: [facility] INVASIVE CV LAB;  Service: Cardiovascular;  Laterality: N/A;  . PERIPHERAL VASCULAR CATHETERIZATION Left 03/10/2016   Procedure: Lower Extremity Venography;  Surgeon: Sherren Kerns, MD;  Location: [facility] INVASIVE CV LAB;  Service: Cardiovascular;  Laterality: Left;  . PERIPHERAL VASCULAR CATHETERIZATION Left 03/10/2016   Procedure: Peripheral Vascular Intervention;  Surgeon: Sherren Kerns, MD;  Location: [facility] INVASIVE CV LAB;  Service: Cardiovascular;  Laterality: Left;  Left common and external iliac vein.    Marland Kitchen SHOULDER ARTHROSCOPY Right     Social History   Socioeconomic History  . Marital status: Widowed    Spouse name: Not on file  . Number of children: Not on file  . Years of education: Not on file  . Highest education level: Not on file  Occupational History  . Occupation: Air traffic controller: EN[employer]  Comment: Media planner  Tobacco Use  . Smoking status: Former Smoker    Packs/day: 2.00    Years: 20.00    Pack years: 40.00    Types: Cigarettes, Pipe, Cigars    Quit date: 03/20/1990    Years since quitting: 29.3  . Smokeless tobacco: Current User    Types: Chew  Substance and Sexual Activity  . Alcohol use: Yes     Comment: 2 beers every 2 months  . Drug use: No  . Sexual activity: Not Currently  Other Topics Concern  . Not on file  Social History Narrative  . Not on file   Social Determinants of Health   Financial Resource Strain:   . Difficulty of Paying Living Expenses:   Food Insecurity:   . Worried About Programme researcher, broadcasting/film/video in the Last Year:   . Barista in the Last Year:   Transportation Needs:   . Freight forwarder (Medical):   Marland Kitchen Lack of Transportation (Non-Medical):   Physical Activity:   . Days of Exercise per Week:   . Minutes of Exercise per Session:   Stress:   . Feeling of Stress :   Social Connections:   . Frequency of Communication with Friends and Family:   . Frequency of Social Gatherings with Friends and Family:   . Attends Religious Services:   . Active Member of Clubs or Organizations:   . Attends Banker Meetings:   Marland Kitchen Marital Status:   Intimate Partner Violence:   . Fear of Current or Ex-Partner:   . Emotionally Abused:   Marland Kitchen Physically Abused:   . Sexually Abused:     Family History  Adopted: Yes    Current Outpatient Medications  Medication Sig Dispense Refill  . budesonide-formoterol (SYMBICORT) 160-4.5 MCG/ACT inhaler Inhale 2 puffs into the lungs 2 (two) times daily. 1 Inhaler 2  . carvedilol (COREG) 25 MG tablet TAKE 1 TABLET BY MOUTH EVERY 12 HOURS FOR HEART AND BLOOD PRESSURE.    . Dulaglutide (TRULICITY) 1.5 MG/0.5ML SOPN once a week.     Marland Kitchen ELIQUIS 5 MG TABS tablet Take 5 mg by mouth 2 (two) times daily.   1  . ezetimibe (ZETIA) 10 MG tablet Take 10 mg by mouth every morning.    . gabapentin (NEURONTIN) 600 MG tablet Take 600 mg by mouth every morning.     . Insulin Degludec (TRESIBA FLEXTOUCH) 200 UNIT/ML SOPN 70 Units daily.     . metFORMIN (GLUCOPHAGE) 1000 MG tablet Take 500 mg by mouth 2 (two) times daily with a meal.     . nitroGLYCERIN (NITROSTAT) 0.4 MG SL tablet Place 1 tablet (0.4 mg total) under the tongue every 5  (five) minutes as needed for chest pain. 30 tablet 4  . rosuvastatin (CRESTOR) 40 MG tablet Take 40 mg by mouth every evening.     Marland Kitchen albuterol (VENTOLIN HFA) 108 (90 Base) MCG/ACT inhaler Inhale 1-2 puffs into the lungs every 4 (four) hours as needed for wheezing or shortness of breath. 8 g 3   No current facility-administered medications for this visit.    No Known Allergies   REVIEW OF SYSTEMS:   [X]  denotes positive finding, [ ]  denotes negative finding Cardiac  Comments:  Chest pain or chest pressure:    Shortness of breath upon exertion: x   Short of breath when lying flat:    Irregular heart rhythm:        Vascular  Pain in calf, thigh, or hip brought on by ambulation:    Pain in feet at night that wakes you up from your sleep:     Blood clot in your veins:    Leg swelling:         Pulmonary    Oxygen at home:    Productive cough:     Wheezing:         Neurologic    Sudden weakness in arms or legs:     Sudden numbness in arms or legs:     Sudden onset of difficulty speaking or slurred speech:    Temporary loss of vision in one eye:     Problems with dizziness:         Gastrointestinal    Blood in stool:     Vomited blood:         Genitourinary    Burning when urinating:     Blood in urine:        Psychiatric    Major depression:         Hematologic    Bleeding problems:    Problems with blood clotting too easily:        Skin    Rashes or ulcers:        Constitutional    Fever or chills:      PHYSICAL EXAMINATION:  Vitals:   07/02/19 0918  BP: 118/70  Pulse: 78  Resp: 20  Temp: (!) 97 F (36.1 C)  SpO2: 97%  Weight: 271 lb 11.2 oz (123.2 kg)  Height: 6\' 1"  (1.854 m)    General:  WDWN in NAD; vital signs documented above Gait: Steady, unaided HENT: WNL, normocephalic Pulmonary: normal non-labored breathing , without Rales, rhonchi,  wheezing Cardiac: regular HR, without  Murmurs without carotid bruits Abdomen: soft, NT, no  masses Skin: without rashes Vascular Exam/Pulses:  Right Left  Radial 2+ (normal) 2+ (normal)  Ulnar absent absent  Femoral Not eval Not eval  Popliteal 2+ (normal) 2+ (normal)  DP 2+ (normal) 2+ (normal)  PT 2+ (normal) 2+ (normal)   Extremities: without ischemic changes, without Gangrene , without cellulitis; without open wounds;  Musculoskeletal: no muscle wasting or atrophy  Neurologic: A&O X 3;  No focal weakness or paresthesias are detected Psychiatric:  The pt has Normal affect.   Non-Invasive Vascular Imaging:   IVC/Iliac: Patent iliac vein stenting with no evidence of thrombus. Areas  of limited visualization due to overlying bowel gas.  Visualization of proximal Inferior Vena Cava, mid inferior vena cava and  distal Inferior Vena Cava was limited.   ASSESSMENT/PLAN:: 59 y.o. male here for follow up for left iliac DVT in 2015 who underwent left CIV and EIV stent placement.  He is compliant with anticoagulant and has no sequela of pot-thrombotic syndrome. Stents are patent.   -Follow-up in one year or sooner for lower extremity pain or swelling   Barbie Banner, PA-C [facility] [phone]  Clinic MD:   Scot Dock

## 2019-07-03 ENCOUNTER — Other Ambulatory Visit: Payer: Self-pay | Admitting: *Deleted

## 2019-07-03 DIAGNOSIS — Z86718 Personal history of other venous thrombosis and embolism: Secondary | ICD-10-CM

## 2019-07-03 DIAGNOSIS — I82422 Acute embolism and thrombosis of left iliac vein: Secondary | ICD-10-CM

## 2019-08-04 ENCOUNTER — Other Ambulatory Visit: Payer: Self-pay

## 2019-08-04 ENCOUNTER — Encounter: Payer: Self-pay | Admitting: Primary Care

## 2019-08-04 ENCOUNTER — Other Ambulatory Visit (INDEPENDENT_AMBULATORY_CARE_PROVIDER_SITE_OTHER): Payer: Medicare Other

## 2019-08-04 ENCOUNTER — Ambulatory Visit (INDEPENDENT_AMBULATORY_CARE_PROVIDER_SITE_OTHER): Payer: Medicare Other | Admitting: Primary Care

## 2019-08-04 ENCOUNTER — Ambulatory Visit (INDEPENDENT_AMBULATORY_CARE_PROVIDER_SITE_OTHER): Payer: Medicare Other

## 2019-08-04 VITALS — BP 132/64 | HR 74 | Temp 97.8°F | Ht 73.0 in | Wt 277.8 lb

## 2019-08-04 DIAGNOSIS — R0602 Shortness of breath: Secondary | ICD-10-CM

## 2019-08-04 DIAGNOSIS — J4540 Moderate persistent asthma, uncomplicated: Secondary | ICD-10-CM

## 2019-08-04 DIAGNOSIS — Z86711 Personal history of pulmonary embolism: Secondary | ICD-10-CM | POA: Diagnosis not present

## 2019-08-04 LAB — CBC WITH DIFFERENTIAL/PLATELET
Basophils Absolute: 0.1 K/uL (ref 0.0–0.1)
Basophils Relative: 1 % (ref 0.0–3.0)
Eosinophils Absolute: 0.3 K/uL (ref 0.0–0.7)
Eosinophils Relative: 2.6 % (ref 0.0–5.0)
HCT: 40.8 % (ref 39.0–52.0)
Hemoglobin: 14.5 g/dL (ref 13.0–17.0)
Lymphocytes Relative: 31.3 % (ref 12.0–46.0)
Lymphs Abs: 3.8 K/uL (ref 0.7–4.0)
MCHC: 35.6 g/dL (ref 30.0–36.0)
MCV: 82.1 fl (ref 78.0–100.0)
Monocytes Absolute: 0.7 K/uL (ref 0.1–1.0)
Monocytes Relative: 5.9 % (ref 3.0–12.0)
Neutro Abs: 7.2 K/uL (ref 1.4–7.7)
Neutrophils Relative %: 59.2 % (ref 43.0–77.0)
Platelets: 202 K/uL (ref 150.0–400.0)
RBC: 4.97 Mil/uL (ref 4.22–5.81)
RDW: 14.5 % (ref 11.5–15.5)
WBC: 12.2 K/uL — ABNORMAL HIGH (ref 4.0–10.5)

## 2019-08-04 NOTE — Progress Notes (Signed)
@Patient  ID: , male    DOB: 02-07-1961, 59 y.o.   MRN: [id number]  Chief Complaint  Patient presents with  . Follow-up    Sob same with exertion,cough-white,occass. wheezing    Referring provider: [id number], MD  HPI: 59 year old male, former smoker quit in 1992 (40 pack year hx). 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 Patient of Dr. 2020, seen for initial consult on 03/10/19 for dyspnea. Referred by cardiology. Patient has had shortness of breath since being diagnosed with pulmonary emboli in 2019. Cardiac work-up including stress test which was unrevealing. PFTs consistent with asthma, he had a significant bronchodilator response. Started on Symbicort 160 two puffs twice daily and prn albuterol hfa.   Previous [facility] pulmonary encounters: 05/06/2019 Patient presents today for 2 month follow-up. Reports no noticeable improvement with Symbicort 160. Feels some improvement with Albuterol rescue inhaler. He has noticed less chest tightness and wheezing in the last 8-10 days.  Continues to experience dyspnea with exertion. States that he becomes easily winded walking to the mailbox or when speaking. He has a cough with clear mucus production. He signed up at [employer] but states that the work out was too intense for him.    08/04/2019-Interim history Patient presents today for 32-month follow-up persistent asthma.  He states that his shortness of breath is at baseline.  He has a productive cough with white mucus and occasional wheezing.  He is maintained on Symbicort 160 2 puffs twice daily. He hasn't noticed any significant improvement on inhaler. He has some congestion and wheezing. He did not get aerochamber. He tries to get some exercise but reports that his heart rate goes up to 160. He last saw cardiology in December, due for follow-up in June. He continues Eliquis 5 mg twice daily for recurrent DVT and PE in 2019. He is not interested in  getting COVID vaccine.    TESTING Cardiac: 12/03/18 Echocardiogram- EF 45-50%, mildly reduced systolic function, mild LVH, abnormal LV longitudinal strain, left atrial size mildly dilated.   Pulmonary function testing: 08/04/17- FVC 3.07 (57%) FEV1 2.48 (60%) Ratio 83  TLC 97% RV 177% DLCO 71%. Significant bronchodilator response in FEV1 14% Interpretation: Normal spirometry. Significant bronchodilator response present. Air trapping present. Mildly reduced DLCO. This may suggest asthma.   No Known Allergies  Immunization History  Administered Date(s) Administered  . Influenza,inj,Quad PF,6+ Mos 05/06/2018  . Influenza-Unspecified 07/27/2017  . Tdap 03/20/2010    Past Medical History:  Diagnosis Date  . Atherosclerosis of native coronary artery of native heart with stable angina pectoris (HCC) 06/09/2015  . Coronary artery disease   . Diabetes mellitus without complication (HCC)   . DVT (deep venous thrombosis) (HCC) 03/07/2016  . Dyspnea 08/28/2017   2019: onset after PE  . High cholesterol   . History of deep venous thrombosis 03/07/2016   Overview:  2017: LLE, extensive, thrombectomy and 2 venous stents, no PE . Post-trauma. Xarelto  . History of pulmonary embolism 07/27/2017   2019: occurred off NOAC  . Left thigh pain 03/03/2016  . Mixed hyperlipidemia 06/09/2015  . Nephrolithiasis 03/14/2016  . Type 2 diabetes mellitus with microalbuminuria, with long-term current use of insulin (HCC) 06/09/2015   2018: UACR=200    Tobacco History: Social History   Tobacco Use  Smoking Status Former Smoker  . Packs/day: 2.00  . Years: 20.00  . Pack years: 40.00  . Types: Cigarettes, Pipe, Cigars  . Quit date: 03/20/1990  .  Years since quitting: 29.3  Smokeless Tobacco Current User  . Types: Chew   Ready to quit: Not Answered Counseling given: Not Answered   Outpatient Medications Prior to Visit  Medication Sig Dispense Refill  . budesonide-formoterol (SYMBICORT) 160-4.5  MCG/ACT inhaler Inhale 2 puffs into the lungs 2 (two) times daily. 1 Inhaler 2  . carvedilol (COREG) 25 MG tablet TAKE 1 TABLET BY MOUTH EVERY 12 HOURS FOR HEART AND BLOOD PRESSURE.    . Dulaglutide (TRULICITY) 1.5 MG/0.5ML SOPN once a week.     Marland Kitchen ELIQUIS 5 MG TABS tablet Take 5 mg by mouth 2 (two) times daily.   1  . ezetimibe (ZETIA) 10 MG tablet Take 10 mg by mouth every morning.    . gabapentin (NEURONTIN) 600 MG tablet Take 600 mg by mouth every morning.     . Insulin Degludec (TRESIBA FLEXTOUCH) 200 UNIT/ML SOPN 70 Units daily.     . metFORMIN (GLUCOPHAGE) 1000 MG tablet Take 500 mg by mouth 2 (two) times daily with a meal.     . nitroGLYCERIN (NITROSTAT) 0.4 MG SL tablet Place 1 tablet (0.4 mg total) under the tongue every 5 (five) minutes as needed for chest pain. 30 tablet 4  . rosuvastatin (CRESTOR) 40 MG tablet Take 40 mg by mouth every evening.     Marland Kitchen albuterol (VENTOLIN HFA) 108 (90 Base) MCG/ACT inhaler Inhale 1-2 puffs into the lungs every 4 (four) hours as needed for wheezing or shortness of breath. 8 g 3   No facility-administered medications prior to visit.    Review of Systems  Review of Systems  Respiratory: Positive for shortness of breath. Negative for cough and wheezing.   Cardiovascular: Positive for leg swelling.   Physical Exam  BP 132/64 (BP Location: Left Arm, Cuff Size: Large)   Pulse 74   Temp 97.8 F (36.6 C) (Temporal)   Ht 6\' 1"  (1.854 m)   Wt 277 lb 12.8 oz (126 kg)   SpO2 100%   BMI 36.65 kg/m  Physical Exam Constitutional:      Appearance: Normal appearance. He is obese. He is not ill-appearing.  HENT:     Head: Normocephalic and atraumatic.     Mouth/Throat:     Mouth: Mucous membranes are moist.     Pharynx: Oropharynx is clear.     Comments: Poor dentition  Cardiovascular:     Rate and Rhythm: Normal rate and regular rhythm.     Comments: +trace-1BLE Pulmonary:     Effort: Pulmonary effort is normal. No respiratory distress.      Breath sounds: No wheezing.     Comments: CTA; no wheezing or rales  Abdominal:     General: There is distension.     Comments: Large abdomen   Skin:    Comments: Scabbing to BLE  Neurological:     General: No focal deficit present.     Mental Status: He is alert and oriented to person, place, and time. Mental status is at baseline.  Psychiatric:        Mood and Affect: Mood normal.        Behavior: Behavior normal.        Thought Content: Thought content normal.        Judgment: Judgment normal.      Lab Results:  CBC    Component Value Date/Time   WBC 9.8 03/11/2016 0340   RBC 4.67 03/11/2016 0340   HGB 13.3 03/11/2016 0340   HCT 38.7 (L) 03/11/2016  0340   PLT 146 (L) 03/11/2016 0340   MCV 82.9 03/11/2016 0340   MCH 28.5 03/11/2016 0340   MCHC 34.4 03/11/2016 0340   RDW 13.3 03/11/2016 0340   LYMPHSABS 2.7 03/06/2016 1742   MONOABS 0.5 03/06/2016 1742   EOSABS 0.3 03/06/2016 1742   BASOSABS 0.0 03/06/2016 1742    BMET    Component Value Date/Time   NA 139 03/11/2016 0340   K 3.6 03/11/2016 0340   CL 106 03/11/2016 0340   CO2 21 (L) 03/11/2016 0340   GLUCOSE 267 (H) 03/11/2016 0340   BUN 13 03/11/2016 0340   CREATININE 1.73 (H) 03/11/2016 0340   CALCIUM 8.3 (L) 03/11/2016 0340   GFRNONAA 43 (L) 03/11/2016 0340   GFRAA 49 (L) 03/11/2016 0340    BNP No results found for: BNP  ProBNP No results found for: PROBNP  Imaging: DG Chest 2 View  Result Date: 08/04/2019 CLINICAL DATA:  Dyspnea EXAM: CHEST - 2 VIEW COMPARISON:  2019 FINDINGS: The heart size and mediastinal contours are within normal limits. Both lungs are clear. No pleural effusion or pneumothorax. No acute osseous abnormality. IMPRESSION: No acute process in the chest. Electronically Signed   By: Macy Mis M.D.   On: 08/04/2019 12:38     Assessment & Plan:   Moderate persistent asthma without complication - PFTs in 4496 showed moderate restrictive lung disease with positive  bronchodilator response. Mild diffusion defect.  - Patient continues to have moderate dyspnea despite addition of Symbicort 160 two puffs twice daily  -  He originally did appreciate less chest tightness and wheezing on ICS/LABA  - Continue to recommend he use an aero-chamber with inhaler to ensure full delivery of medication  Dyspnea - Checking CXR and labs (cbc with diff, IgE, BNP) - May need additional cardiac work up for exertional tachycardia   History of pulmonary embolism - Chronic DVT/PE 2019, continues Eliquis 5mg  twice daily  - 12/03/18 Echocardiogram- EF 45-50%, mildly reduced systolic function, mild LVH, abnormal LV longitudinal strain, left atrial size mildly dilated.    > 30 mins spent face to face with patient   Martyn Ehrich, NP 08/04/2019

## 2019-08-04 NOTE — Assessment & Plan Note (Addendum)
-   Chronic DVT/PE 2019, continues Eliquis 5mg  twice daily  - 12/03/18 Echocardiogram- EF 45-50%, mildly reduced systolic function, mild LVH, abnormal LV longitudinal strain, left atrial size mildly dilated.

## 2019-08-04 NOTE — Assessment & Plan Note (Addendum)
-   PFTs in 2019 showed moderate restrictive lung disease with positive bronchodilator response. Mild diffusion defect.  - Patient continues to have moderate dyspnea despite addition of Symbicort 160 two puffs twice daily  -  He originally did appreciate less chest tightness and wheezing on ICS/LABA  - Continue to recommend he use an aero-chamber with inhaler to ensure full delivery of medication

## 2019-08-04 NOTE — Assessment & Plan Note (Signed)
-   Checking CXR and labs (cbc with diff, IgE, BNP) - May need additional cardiac work up for exertional tachycardia

## 2019-08-04 NOTE — Patient Instructions (Addendum)
Recommendations: Use Aerochamber with Symbicort 160 - take two puffs twice daily  Continue Eliquis 5mg  twice daily   Orders: Labs at [facility] office (BNP, CBC with diff, IgE) CXR today re: Dyspnea   Rx: Aerochamber   Follow-up: 3-4 months with Dr. [facility]

## 2019-08-04 NOTE — Progress Notes (Signed)
Please let patient know CXR showed clear lungs. No pleural effusion or pneumothorax. No further imaging needed

## 2019-08-05 LAB — BRAIN NATRIURETIC PEPTIDE: Pro B Natriuretic peptide (BNP): 30 pg/mL (ref 0.0–100.0)

## 2019-08-05 LAB — IGE: IgE (Immunoglobulin E), Serum: 34 kU/L (ref <=114)

## 2019-08-05 MED ORDER — AZITHROMYCIN 250 MG PO TABS: ORAL_TABLET | ORAL | 0 refills | Status: DC

## 2019-08-05 MED ORDER — MONTELUKAST SODIUM 10 MG PO TABS: 10.0000 mg | ORAL_TABLET | Freq: Every day | ORAL | 6 refills | Status: DC

## 2019-08-05 NOTE — Progress Notes (Signed)
Reviewed and agree with assessment/plan.   Zaiden Ludlum, MD [facility] 08/05/2019, 8:36 AM Pager:  [phone]  

## 2019-08-05 NOTE — Progress Notes (Signed)
Please let patient know BNP (fluid level) normal. IgE was normal. WBC and eosinophils were elevated. I am going to start him on a medication called Singulair for allergies/asthma which he will take daily. Continue to use Symbicort two puffs twice daily every day with aero chamber. Also sending in Zpack for cough.

## 2019-08-05 NOTE — Progress Notes (Signed)
Please let patient know fluid level was normal. IgE was normal. WBC and eosinophils were elevated. I am going to start him on a medication called Singulair for allergies/asthma. Also sending in Zpack for cough.

## 2019-08-06 ENCOUNTER — Telehealth: Payer: Self-pay | Admitting: Primary Care

## 2019-08-06 NOTE — Telephone Encounter (Signed)
  Advised pt of results. Pt understood and nothing further is needed.     Roberto Bayley, NP  08/05/2019 5:32 PM EDT    Please let patient know fluid level was normal. IgE was normal. WBC and eosinophils were elevated. I am going to start him on a medication called Singulair for allergies/asthma. Also sending in Zpack for coug

## 2019-08-07 NOTE — Progress Notes (Signed)
Provided lab results per Buelah Manis NP.  Made patient aware of prescriptions sent to the pharmacy.  Pt. Verbalized understanding.

## 2019-09-02 ENCOUNTER — Encounter: Payer: Self-pay | Admitting: Cardiology

## 2019-09-02 ENCOUNTER — Other Ambulatory Visit: Payer: Self-pay

## 2019-09-02 ENCOUNTER — Ambulatory Visit (INDEPENDENT_AMBULATORY_CARE_PROVIDER_SITE_OTHER): Payer: Medicare Other | Admitting: Cardiology

## 2019-09-02 VITALS — BP 112/60 | HR 90 | Ht 73.0 in | Wt 274.0 lb

## 2019-09-02 DIAGNOSIS — Z86711 Personal history of pulmonary embolism: Secondary | ICD-10-CM

## 2019-09-02 DIAGNOSIS — I25118 Atherosclerotic heart disease of native coronary artery with other forms of angina pectoris: Secondary | ICD-10-CM

## 2019-09-02 DIAGNOSIS — E1129 Type 2 diabetes mellitus with other diabetic kidney complication: Secondary | ICD-10-CM

## 2019-09-02 DIAGNOSIS — Z86718 Personal history of other venous thrombosis and embolism: Secondary | ICD-10-CM

## 2019-09-02 DIAGNOSIS — E782 Mixed hyperlipidemia: Secondary | ICD-10-CM | POA: Diagnosis not present

## 2019-09-02 DIAGNOSIS — Z7901 Long term (current) use of anticoagulants: Secondary | ICD-10-CM

## 2019-09-02 DIAGNOSIS — Z794 Long term (current) use of insulin: Secondary | ICD-10-CM

## 2019-09-02 DIAGNOSIS — R809 Proteinuria, unspecified: Secondary | ICD-10-CM

## 2019-09-02 NOTE — Progress Notes (Signed)
Cardiology Office Note:    Date:  09/02/2019   ID:  Roberto Sosa, DOB [birth year], MRN [id number]  PCP:  Algis Greenhouse, MD  Cardiologist:  Jenean Lindau, MD   Referring MD: Algis Greenhouse, MD    ASSESSMENT:    1. Atherosclerosis of native coronary artery of native heart with stable angina pectoris ([place])   2. Mixed hyperlipidemia   3. History of deep venous thrombosis   4. History of pulmonary embolism   5. Long term (current) use of anticoagulants   6. Type 2 diabetes mellitus with microalbuminuria, with long-term current use of insulin (HCC)    PLAN:    In order of problems listed above:  1. Coronary artery disease: Stable.  Secondary prevention stressed to the patient.  Importance of compliance with diet medication stressed and he vocalized understanding.  Importance of regular walking stressed and weight reduction was stressed and he promises to do better. 2. History of DVT and pulmonary thromboembolism on long-term anticoagulation and he takes his medications very regularly.  Benefits and potential risks of anticoagulation explained and he understands. 3. History of diabetes mellitus and dyslipidemia: Lipids followed by primary care physician.  Checked in the month of March and I reviewed them and found them to be fine and explained this with him. 4. Patient will be seen in follow-up appointment in 6 months or earlier if the patient has any concerns    Medication Adjustments/Labs and Tests Ordered: Current medicines are reviewed at length with the patient today.  Concerns regarding medicines are outlined above.  No orders of the defined types were placed in this encounter.  No orders of the defined types were placed in this encounter.    Chief Complaint  Patient presents with  . Follow-up     History of Present Illness:    Roberto Sosa is a 59 y.o. male.  Patient has past medical history of coronary artery disease, essential hypertension mixed dyslipidemia  diabetes mellitus and history of DVT and pulmonary thromboembolism.  He has history of asthma and managed by pulmonologist.  He denies any problems at this time and takes care of activities of daily living.  No chest pain orthopnea or PND.  Because of dyspnea on exertion he leads a sedentary lifestyle.  At the time of my evaluation, the patient is alert awake oriented and in no distress.  Past Medical History:  Diagnosis Date  . Atherosclerosis of native coronary artery of native heart with stable angina pectoris ([place]) 06/09/2015  . Coronary artery disease   . Diabetes mellitus without complication ([place])   . DVT (deep venous thrombosis) ([place]) 03/07/2016  . Dyspnea 08/28/2017   2019: onset after PE  . High cholesterol   . History of deep venous thrombosis 03/07/2016   Overview:  2017: LLE, extensive, thrombectomy and 2 venous stents, no PE . Post-trauma. Xarelto  . History of pulmonary embolism 07/27/2017   2019: occurred off NOAC  . Left thigh pain 03/03/2016  . Mixed hyperlipidemia 06/09/2015  . Nephrolithiasis 03/14/2016  . Type 2 diabetes mellitus with microalbuminuria, with long-term current use of insulin ([place]) 06/09/2015   2018: UACR=200    Past Surgical History:  Procedure Laterality Date  . CARDIAC CATHETERIZATION Left 03/09/2016   Procedure: Intravascular Ultrasound/IVUS;  Surgeon: Conrad Blairsville, MD;  Location: [facility];  Service: Cardiovascular;  Laterality: Left;  IVC-LT COMMON ILIAC VEIN-LT EXR ILIAC VEIN-LT DEEP FEMORAL/POPLITEAL VEIN LYSIS AND THROMBECTOMY  . CARDIAC CATHETERIZATION Left 03/10/2016  Procedure: Intravascular Ultrasound/IVUS;  Surgeon: Sherren Kerns, MD;  Location: [facility] INVASIVE CV LAB;  Service: Cardiovascular;  Laterality: Left;  Lower extremity venous.  Marland Kitchen CATARACT EXTRACTION Bilateral 02/2019  . HUMERUS FRACTURE SURGERY Left   . ORTHOPEDIC SURGERY Bilateral 06/2006   multiple fractures and breaks  . PERIPHERAL VASCULAR CATHETERIZATION Left  03/09/2016   Procedure: Lower Extremity Venography;  Surgeon: Fransisco Hertz, MD;  Location: [facility] INVASIVE CV LAB;  Service: Cardiovascular;  Laterality: Left;  . PERIPHERAL VASCULAR CATHETERIZATION N/A 03/09/2016   Procedure: IVC Venography;  Surgeon: Fransisco Hertz, MD;  Location: [facility] INVASIVE CV LAB;  Service: Cardiovascular;  Laterality: N/A;  . PERIPHERAL VASCULAR CATHETERIZATION Left 03/10/2016   Procedure: Lower Extremity Venography;  Surgeon: Sherren Kerns, MD;  Location: [facility] INVASIVE CV LAB;  Service: Cardiovascular;  Laterality: Left;  . PERIPHERAL VASCULAR CATHETERIZATION Left 03/10/2016   Procedure: Peripheral Vascular Intervention;  Surgeon: Sherren Kerns, MD;  Location: [facility] INVASIVE CV LAB;  Service: Cardiovascular;  Laterality: Left;  Left common and external iliac vein.    Marland Kitchen SHOULDER ARTHROSCOPY Right     Current Medications: Current Meds  Medication Sig  . albuterol (VENTOLIN HFA) 108 (90 Base) MCG/ACT inhaler Inhale 1-2 puffs into the lungs every 4 (four) hours as needed for wheezing or shortness of breath.  Marland Kitchen azithromycin (ZITHROMAX) 250 MG tablet Zpack taper as directed  . budesonide-formoterol (SYMBICORT) 160-4.5 MCG/ACT inhaler Inhale 2 puffs into the lungs 2 (two) times daily.  . carvedilol (COREG) 25 MG tablet TAKE 1 TABLET BY MOUTH EVERY 12 HOURS FOR HEART AND BLOOD PRESSURE.  . Dulaglutide (TRULICITY) 1.5 MG/0.5ML SOPN once a week.   Marland Kitchen ELIQUIS 5 MG TABS tablet Take 5 mg by mouth 2 (two) times daily.   Marland Kitchen ezetimibe (ZETIA) 10 MG tablet Take 10 mg by mouth every morning.  . gabapentin (NEURONTIN) 600 MG tablet Take 600 mg by mouth every morning.   . Insulin Degludec (TRESIBA FLEXTOUCH) 200 UNIT/ML SOPN 70 Units daily.   . metFORMIN (GLUCOPHAGE) 1000 MG tablet Take 500 mg by mouth 2 (two) times daily with a meal.   . montelukast (SINGULAIR) 10 MG tablet Take 1 tablet (10 mg total) by mouth at bedtime.  . nitroGLYCERIN (NITROSTAT) 0.4 MG SL tablet Place 1 tablet (0.4 mg  total) under the tongue every 5 (five) minutes as needed for chest pain.  . rosuvastatin (CRESTOR) 40 MG tablet Take 40 mg by mouth every evening.      Allergies:   Patient has no known allergies.   Social History   Socioeconomic History  . Marital status: Widowed    Spouse name: Not on file  . Number of children: Not on file  . Years of education: Not on file  . Highest education level: Not on file  Occupational History  . 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 Smoking status: Former Smoker    Packs/day: 2.00    Years: 20.00    Pack years: 40.00    Types: Cigarettes, Pipe, Cigars    Quit date: 03/20/1990    Years since quitting: 29.4  . Smokeless tobacco: Current User    Types: Chew  Substance and Sexual Activity  . Alcohol use: Yes    Comment: 2 beers every 2 months  . Drug use: No  . Sexual activity: Not Currently  Other Topics Concern  . Not on file  Social History Narrative  .  Not on file   Social Determinants of Health   Financial Resource Strain:   . Difficulty of Paying Living Expenses:   Food Insecurity:   . Worried About Programme researcher, broadcasting/film/video in the Last Year:   . Barista in the Last Year:   Transportation Needs:   . Freight forwarder (Medical):   Marland Kitchen Lack of Transportation (Non-Medical):   Physical Activity:   . Days of Exercise per Week:   . Minutes of Exercise per Session:   Stress:   . Feeling of Stress :   Social Connections:   . Frequency of Communication with Friends and Family:   . Frequency of Social Gatherings with Friends and Family:   . Attends Religious Services:   . Active Member of Clubs or Organizations:   . Attends Banker Meetings:   Marland Kitchen Marital Status:      Family History: The patient's family history is not on file. He was adopted.  ROS:   Please see the history of present illness.    All other systems reviewed and are negative.  EKGs/Labs/Other  Studies Reviewed:    The following studies were reviewed today: I discussed my findings with the patient at extensive length   Recent Labs: 08/04/2019: Hemoglobin 14.5; Platelets 202.0; Pro B Natriuretic peptide (BNP) 30.0  Recent Lipid Panel No results found for: CHOL, TRIG, HDL, CHOLHDL, VLDL, LDLCALC, LDLDIRECT  Physical Exam:    VS:  BP 112/60   Pulse 90   Ht 6\' 1"  (1.854 m)   Wt 274 lb (124.3 kg)   SpO2 99%   BMI 36.15 kg/m     Wt Readings from Last 3 Encounters:  09/02/19 274 lb (124.3 kg)  08/04/19 277 lb 12.8 oz (126 kg)  07/02/19 271 lb 11.2 oz (123.2 kg)     GEN: Patient is in no acute distress HEENT: Normal NECK: No JVD; No carotid bruits LYMPHATICS: No lymphadenopathy CARDIAC: Hear sounds regular, 2/6 systolic murmur at the apex. RESPIRATORY:  Clear to auscultation without rales, wheezing or rhonchi  ABDOMEN: Soft, non-tender, non-distended MUSCULOSKELETAL:  No edema; No deformity  SKIN: Warm and dry NEUROLOGIC:  Alert and oriented x 3 PSYCHIATRIC:  Normal affect   Signed, 07/04/19, MD  09/02/2019 9:20 AM    [facility]

## 2019-09-02 NOTE — Patient Instructions (Signed)

## 2019-11-04 ENCOUNTER — Other Ambulatory Visit: Payer: Self-pay

## 2019-12-29 DIAGNOSIS — L02231 Carbuncle of abdominal wall: Secondary | ICD-10-CM

## 2019-12-29 HISTORY — DX: Carbuncle of abdominal wall: L02.231

## 2020-03-03 ENCOUNTER — Other Ambulatory Visit: Payer: Self-pay

## 2020-03-03 DIAGNOSIS — I2510 Atherosclerotic heart disease of native coronary artery without angina pectoris: Secondary | ICD-10-CM | POA: Insufficient documentation

## 2020-03-03 DIAGNOSIS — E7800 Pure hypercholesterolemia, unspecified: Secondary | ICD-10-CM | POA: Insufficient documentation

## 2020-03-03 DIAGNOSIS — E119 Type 2 diabetes mellitus without complications: Secondary | ICD-10-CM | POA: Insufficient documentation

## 2020-03-04 ENCOUNTER — Other Ambulatory Visit: Payer: Self-pay

## 2020-03-04 ENCOUNTER — Ambulatory Visit (INDEPENDENT_AMBULATORY_CARE_PROVIDER_SITE_OTHER): Payer: Medicare Other | Admitting: Cardiology

## 2020-03-04 ENCOUNTER — Encounter: Payer: Self-pay | Admitting: Cardiology

## 2020-03-04 VITALS — BP 120/70 | HR 88 | Ht 73.0 in | Wt 267.4 lb

## 2020-03-04 DIAGNOSIS — Z794 Long term (current) use of insulin: Secondary | ICD-10-CM | POA: Diagnosis not present

## 2020-03-04 DIAGNOSIS — E7800 Pure hypercholesterolemia, unspecified: Secondary | ICD-10-CM

## 2020-03-04 DIAGNOSIS — Z86711 Personal history of pulmonary embolism: Secondary | ICD-10-CM

## 2020-03-04 DIAGNOSIS — R809 Proteinuria, unspecified: Secondary | ICD-10-CM | POA: Diagnosis not present

## 2020-03-04 DIAGNOSIS — E1129 Type 2 diabetes mellitus with other diabetic kidney complication: Secondary | ICD-10-CM | POA: Diagnosis not present

## 2020-03-04 DIAGNOSIS — I25118 Atherosclerotic heart disease of native coronary artery with other forms of angina pectoris: Secondary | ICD-10-CM | POA: Diagnosis not present

## 2020-03-04 DIAGNOSIS — E782 Mixed hyperlipidemia: Secondary | ICD-10-CM

## 2020-03-04 DIAGNOSIS — I259 Chronic ischemic heart disease, unspecified: Secondary | ICD-10-CM

## 2020-03-04 MED ORDER — METOPROLOL TARTRATE 100 MG PO TABS: 100.0000 mg | ORAL_TABLET | Freq: Once | ORAL | 0 refills | Status: DC

## 2020-03-04 NOTE — Patient Instructions (Signed)
Medication Instructions:  No medication changes. *If you need a refill on your cardiac medications before your next appointment, please call your pharmacy*   Lab Work: Your physician recommends that you return for lab work in: 1 week prior to your CT scan. You can come Monday through Friday 8:30 am to 12:00 pm and 1:15 to 4:30. You do not need to make an appointment as the order has already been placed.  If you have labs (blood work) drawn today and your tests are completely normal, you will receive your results only by: Marland Kitchen MyChart Message (if you have MyChart) OR . A paper copy in the mail If you have any lab test that is abnormal or we need to change your treatment, we will call you to review the results.   Testing/Procedures: Your cardiac CT will be scheduled at:   [facility] [place], [place] [address] [phone]   If scheduled at [facility], please arrive at the [facility] main entrance of [facility] 30 minutes prior to test start time. Proceed to the [facility] Radiology Department (first floor) to check-in and test prep.  Please follow these instructions carefully (unless otherwise directed):  Hold all erectile dysfunction medications at least 3 days (72 hrs) prior to test.  On the Night Before the Test: . Be sure to Drink plenty of water. . Do not consume any caffeinated/decaffeinated beverages or chocolate 12 hours prior to your test. . Do not take any antihistamines 12 hours prior to your test. .  On the Day of the Test: . Drink plenty of water. Do not drink any water within one hour of the test. . Do not eat any food 4 hours prior to the test. . You may take your regular medications prior to the test.  . Take metoprolol (Lopressor) two hours prior to test.      After the Test: . Drink plenty of water. . After receiving IV contrast, you may experience a mild flushed feeling. This is normal. . On occasion, you may  experience a mild rash up to 24 hours after the test. This is not dangerous. If this occurs, you can take Benadryl 25 mg and increase your fluid intake. . If you experience trouble breathing, this can be serious. If it is severe call 911 IMMEDIATELY. If it is mild, please call our office. . If you take any of these medications: Glipizide/Metformin, Avandament, Glucavance, please do not take 48 hours after completing test unless otherwise instructed.   Once we have confirmed authorization from your insurance company, we will call you to set up a date and time for your test. Based on how quickly your insurance processes prior authorizations requests, please allow up to 4 weeks to be contacted for scheduling your Cardiac CT appointment. Be advised that routine Cardiac CT appointments could be scheduled as many as 8 weeks after your provider has ordered it.  For non-scheduling related questions, please contact the cardiac imaging nurse navigator should you have any questions/concerns: Marchia Bond, Cardiac Imaging Nurse Navigator Burley Saver, Interim Cardiac Imaging Nurse [place] Direct Office Dial: [phone]   For scheduling needs, including cancellations and rescheduling, please call Vivien Rota at [phone].     Follow-Up: At [facility], you and your health needs are our priority.  As part of our continuing mission to provide you with exceptional heart care, we have created designated Provider Care Teams.  These Care Teams  include your primary Cardiologist (physician) and Advanced Practice Providers (APPs -  Physician Assistants and Nurse Practitioners) who all work together to provide you with the care you need, when you need it.  We recommend signing up for the patient portal called "MyChart".  Sign up information is provided on this After Visit Summary.  MyChart is used to connect with patients for Virtual Visits (Telemedicine).  Patients are able to  view lab/test results, encounter notes, upcoming appointments, etc.  Non-urgent messages can be sent to your provider as well.   To learn more about what you can do with MyChart, go to [email or web address].    Your next appointment:   2 month(s)  The format for your next appointment:   In Person  Provider:   Jyl Heinz, MD   Other Instructions Cardiac CT Angiogram A cardiac CT angiogram is a procedure to look at the heart and the area around the heart. It may be done to help find the cause of chest pains or other symptoms of heart disease. During this procedure, a substance called contrast dye is injected into the blood vessels in the area to be checked. A large X-ray machine, called a CT scanner, then takes detailed pictures of the heart and the surrounding area. The procedure is also sometimes called a coronary CT angiogram, coronary artery scanning, or CTA. A cardiac CT angiogram allows the health care provider to see how well blood is flowing to and from the heart. The health care provider will be able to see if there are any problems, such as:  Blockage or narrowing of the coronary arteries in the heart.  Fluid around the heart.  Signs of weakness or disease in the muscles, valves, and tissues of the heart. Tell a health care provider about:  Any allergies you have. This is especially important if you have had a previous allergic reaction to contrast dye.  All medicines you are taking, including vitamins, herbs, eye drops, creams, and over-the-counter medicines.  Any blood disorders you have.  Any surgeries you have had.  Any medical conditions you have.  Whether you are pregnant or may be pregnant.  Any anxiety disorders, chronic pain, or other conditions you have that may increase your stress or prevent you from lying still. What are the risks? Generally, this is a safe procedure. However, problems may occur, including: 1. Bleeding. 2. Infection. 3. Allergic  reactions to medicines or dyes. 4. Damage to other structures or organs. 5. Kidney damage from the contrast dye that is used. 6. Increased risk of cancer from radiation exposure. This risk is low. Talk with your health care provider about: ? The risks and benefits of testing. ? How you can receive the lowest dose of radiation. What happens before the procedure? 1. Wear comfortable clothing and remove any jewelry, glasses, dentures, and hearing aids. 2. Follow instructions from your health care provider about eating and drinking. This may include: ? For 12 hours before the procedure -- avoid caffeine. This includes tea, coffee, soda, energy drinks, and diet pills. Drink plenty of water or other fluids that do not have caffeine in them. Being well hydrated can prevent complications. ? For 4-6 hours before the procedure -- stop eating and drinking. The contrast dye can cause nausea, but this is less likely if your stomach is empty. 3. Ask your health care provider about changing or stopping your regular medicines. This is especially important if you are taking diabetes medicines, blood thinners, or medicines to treat  problems with erections (erectile dysfunction). What happens during the procedure?  1. Hair on your chest may need to be removed so that small sticky patches called electrodes can be placed on your chest. These will transmit information that helps to monitor your heart during the procedure. 2. An IV will be inserted into one of your veins. 3. You might be given a medicine to control your heart rate during the procedure. This will help to ensure that good images are obtained. 4. You will be asked to lie on an exam table. This table will slide in and out of the CT machine during the procedure. 5. Contrast dye will be injected into the IV. You might feel warm, or you may get a metallic taste in your mouth. 6. You will be given a medicine called nitroglycerin. This will relax or dilate the  arteries in your heart. 7. The table that you are lying on will move into the CT machine tunnel for the scan. 8. The person running the machine will give you instructions while the scans are being done. You may be asked to: ? Keep your arms above your head. ? Hold your breath. ? Stay very still, even if the table is moving. 9. When the scanning is complete, you will be moved out of the machine. 10. The IV will be removed. The procedure may vary among health care providers and hospitals. What can I expect after the procedure? After your procedure, it is common to have:  A metallic taste in your mouth from the contrast dye.  A feeling of warmth.  A headache from the nitroglycerin. Follow these instructions at home:  Take over-the-counter and prescription medicines only as told by your health care provider.  If you are told, drink enough fluid to keep your urine pale yellow. This will help to flush the contrast dye out of your body.  Most people can return to their normal activities right after the procedure. Ask your health care provider what activities are safe for you.  It is up to you to get the results of your procedure. Ask your health care provider, or the department that is doing the procedure, when your results will be ready.  Keep all follow-up visits as told by your health care provider. This is important. Contact a health care provider if: 1. You have any symptoms of allergy to the contrast dye. These include: ? Shortness of breath. ? Rash or hives. ? A racing heartbeat. Summary  A cardiac CT angiogram is a procedure to look at the heart and the area around the heart. It may be done to help find the cause of chest pains or other symptoms of heart disease.  During this procedure, a large X-ray machine, called a CT scanner, takes detailed pictures of the heart and the surrounding area after a contrast dye has been injected into blood vessels in the area.  Ask your health  care provider about changing or stopping your regular medicines before the procedure. This is especially important if you are taking diabetes medicines, blood thinners, or medicines to treat erectile dysfunction.  If you are told, drink enough fluid to keep your urine pale yellow. This will help to flush the contrast dye out of your body. This information is not intended to replace advice given to you by your health care provider. Make sure you discuss any questions you have with your health care provider. Document Revised: 10/30/2018 Document Reviewed: 10/30/2018 Elsevier Patient Education  2020 Elsevier  Inc.

## 2020-03-04 NOTE — Progress Notes (Signed)
Cardiology Office Note:    Date:  03/04/2020   ID:  Roberto Sosa, DOB [birth year], MRN [id number]  PCP:  Olive Bass, MD  Cardiologist:  Garwin Brothers, MD   Referring MD: Olive Bass, MD    ASSESSMENT:    1. Atherosclerosis of native coronary artery of native heart with stable angina pectoris (HCC)   2. Mixed hyperlipidemia   3. High cholesterol   4. History of pulmonary embolism   5. Type 2 diabetes mellitus with microalbuminuria, with long-term current use of insulin (HCC)    PLAN:    In order of problems listed above:  1. Dyspnea on exertion and chest tightness: I am not sure whether this is pulmonary or from a cardiac component however in view of established atherosclerotic vascular disease I recommended CT scan with FFR and he vocalized understanding. He is agreeable and we will set him up for the test. 2. Essential hypertension: Blood pressure is stable and diet was emphasized. 3. Diabetes mellitus: Not optimally controlled. Patient has been struggling with diet and exercise. He is not able to exercise much because of his a forementioned issues. She also has pulmonary issues and is followed by pulmonologist. 4. History of pulmonary embolism on anticoagulation followed by primary care. 5. Patient will be seen in follow-up appointment in 2 months or earlier if the patient has any concerns    Medication Adjustments/Labs and Tests Ordered: Current medicines are reviewed at length with the patient today.  Concerns regarding medicines are outlined above.  No orders of the defined types were placed in this encounter.  No orders of the defined types were placed in this encounter.    No chief complaint on file.    History of Present Illness:    Roberto Sosa is a 59 y.o. male.  Patient has past medical history of coronary artery disease, essential hypertension dyslipidemia and diabetes mellitus.  He has history of DVT pulmonary thromboembolism in the past.  He  complains of dyspnea and chest tightness on exertion.  This is been going on for the past several months.  No orthopnea or PND.  At the time of my evaluation, the patient is alert awake oriented and in no distress.  Past Medical History:  Diagnosis Date  . Atherosclerosis of native coronary artery of native heart with stable angina pectoris (HCC) 06/09/2015  . Carbuncle of abdominal wall 12/29/2019   Formatting of this note might be different from the original. 12/29/2019: supraumbilical  . Coronary artery disease   . Diabetes mellitus without complication (HCC)   . DVT (deep venous thrombosis) (HCC) 03/07/2016  . Dyspnea 08/28/2017   2019: onset after PE  . High cholesterol   . History of deep venous thrombosis 03/07/2016   Overview:  2017: LLE, extensive, thrombectomy and 2 venous stents, no PE . Post-trauma. Xarelto  . History of pulmonary embolism 07/27/2017   2019: occurred off NOAC  . Left thigh pain 03/03/2016  . Long term (current) use of anticoagulants 11/17/2018  . Mixed hyperlipidemia 06/09/2015  . Moderate persistent asthma without complication 03/20/2019  . Nephrolithiasis 03/14/2016  . Type 2 diabetes mellitus with microalbuminuria, with long-term current use of insulin (HCC) 06/09/2015   2018: UACR=200    Past Surgical History:  Procedure Laterality Date  . CARDIAC CATHETERIZATION Left 03/09/2016   Procedure: Intravascular Ultrasound/IVUS;  Surgeon: Fransisco Hertz, MD;  Location: [facility] INVASIVE CV LAB;  Service: Cardiovascular;  Laterality: Left;  IVC-LT COMMON ILIAC VEIN-LT EXR ILIAC VEIN-LT  DEEP FEMORAL/POPLITEAL VEIN LYSIS AND THROMBECTOMY  . CARDIAC CATHETERIZATION Left 03/10/2016   Procedure: Intravascular Ultrasound/IVUS;  Surgeon: Sherren Kerns, MD;  Location[facility] INVASIVE CV LAB;  Service: Cardiovascular;  Laterality: Left;  Lower extremity venous.  Marland Kitchen CATARACT EXTRACTION Bilateral 02/2019  . HUMERUS FRACTURE SURGERY Left   . ORTHOPEDIC SURGERY Bilateral 06/2006    multiple fractures and breaks  . PERIPHERAL VASCULAR CATHETERIZATION Left 03/09/2016   Procedure: Lower Extremity Venography;  Surgeon: Fransisco Hertz, MD;  Location: [facility] INVASIVE CV LAB;  Service: Cardiovascular;  Laterality: Left;  . PERIPHERAL VASCULAR CATHETERIZATION N/A 03/09/2016   Procedure: IVC Venography;  Surgeon: Fransisco Hertz, MD;  Location: [facility] INVASIVE CV LAB;  Service: Cardiovascular;  Laterality: N/A;  . PERIPHERAL VASCULAR CATHETERIZATION Left 03/10/2016   Procedure: Lower Extremity Venography;  Surgeon: Sherren Kerns, MD;  Location: [facility] INVASIVE CV LAB;  Service: Cardiovascular;  Laterality: Left;  . PERIPHERAL VASCULAR CATHETERIZATION Left 03/10/2016   Procedure: Peripheral Vascular Intervention;  Surgeon: Sherren Kerns, MD;  Location: [facility] INVASIVE CV LAB;  Service: Cardiovascular;  Laterality: Left;  Left common and external iliac vein.    Marland Kitchen SHOULDER ARTHROSCOPY Right     Current Medications: Current Meds  Medication Sig  . budesonide-formoterol (SYMBICORT) 160-4.5 MCG/ACT inhaler Inhale 2 puffs into the lungs 2 (two) times daily.  . carvedilol (COREG) 25 MG tablet TAKE 1 TABLET BY MOUTH EVERY 12 HOURS FOR HEART AND BLOOD PRESSURE.  . Dulaglutide (TRULICITY) 1.5 MG/0.5ML SOPN once a week.   Marland Kitchen ELIQUIS 5 MG TABS tablet Take 5 mg by mouth 2 (two) times daily.   Marland Kitchen ezetimibe (ZETIA) 10 MG tablet Take 10 mg by mouth every morning.  Marland Kitchen FARXIGA 10 MG TABS tablet Take 10 mg by mouth every morning.  . gabapentin (NEURONTIN) 600 MG tablet Take 600 mg by mouth every morning.   . insulin degludec (TRESIBA) 200 UNIT/ML FlexTouch Pen Inject 86 Units into the skin daily.  . metFORMIN (GLUCOPHAGE) 1000 MG tablet Take 1,000 mg by mouth 2 (two) times daily with a meal.  . montelukast (SINGULAIR) 10 MG tablet Take 1 tablet (10 mg total) by mouth at bedtime.  . nitroGLYCERIN (NITROSTAT) 0.4 MG SL tablet Place 1 tablet (0.4 mg total) under the tongue every 5 (five) minutes as needed for chest  pain.  . rosuvastatin (CRESTOR) 40 MG tablet Take 40 mg by mouth every evening.      Allergies:   Patient has no known allergies.   Social History   Socioeconomic History  . Marital status: Widowed    Spouse name: Not on file  . Number of children: Not on file  . Years of education: Not on file  . Highest education level: Not on file  Occupational History  . 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 Smoking status: Former Smoker    Packs/day: 2.00    Years: 20.00    Pack years: 40.00    Types: Cigarettes, Pipe, Cigars    Quit date: 03/20/1990    Years since quitting: 29.9  . Smokeless tobacco: Current User    Types: Chew  Substance and Sexual Activity  . Alcohol use: Yes    Comment: 2 beers every 2 months  . Drug use: No  . Sexual activity: Not Currently  Other Topics Concern  . Not on file  Social History Narrative  . Not on file   Social Determinants of Health  Financial Resource Strain: Not on file  Food Insecurity: Not on file  Transportation Needs: Not on file  Physical Activity: Not on file  Stress: Not on file  Social Connections: Not on file     Family History: The patient's family history is not on file. He was adopted.  ROS:   Please see the history of present illness.    All other systems reviewed and are negative.  EKGs/Labs/Other Studies Reviewed:    The following studies were reviewed today: I discussed my findings with the patient at length.  EKG reveals sinus rhythm and nonspecific ST-T changes   Recent Labs: 08/04/2019: Hemoglobin 14.5; Platelets 202.0; Pro B Natriuretic peptide (BNP) 30.0  Recent Lipid Panel No results found for: CHOL, TRIG, HDL, CHOLHDL, VLDL, LDLCALC, LDLDIRECT  Physical Exam:    VS:  BP 120/70   Pulse 88   Ht 6\' 1"  (1.854 m)   Wt 267 lb 6.4 oz (121.3 kg)   SpO2 96%   BMI 35.28 kg/m     Wt Readings from Last 3 Encounters:  03/04/20 267 lb 6.4 oz (121.3 kg)   09/02/19 274 lb (124.3 kg)  08/04/19 277 lb 12.8 oz (126 kg)     GEN: Patient is in no acute distress HEENT: Normal NECK: No JVD; No carotid bruits LYMPHATICS: No lymphadenopathy CARDIAC: Hear sounds regular, 2/6 systolic murmur at the apex. RESPIRATORY:  Clear to auscultation without rales, wheezing or rhonchi  ABDOMEN: Soft, non-tender, non-distended MUSCULOSKELETAL:  No edema; No deformity  SKIN: Warm and dry NEUROLOGIC:  Alert and oriented x 3 PSYCHIATRIC:  Normal affect   Signed, 08/06/19, MD  03/04/2020 2:48 PM    [facility]

## 2020-03-24 LAB — BASIC METABOLIC PANEL
BUN/Creatinine Ratio: 12 (ref 9–20)
BUN: 14 mg/dL (ref 6–24)
CO2: 23 mmol/L (ref 20–29)
Calcium: 9.5 mg/dL (ref 8.7–10.2)
Chloride: 98 mmol/L (ref 96–106)
Creatinine, Ser: 1.13 mg/dL (ref 0.76–1.27)
GFR calc Af Amer: 82 mL/min/{1.73_m2} (ref >59)
GFR calc non Af Amer: 71 mL/min/{1.73_m2} (ref >59)
Glucose: 217 mg/dL — ABNORMAL HIGH (ref 65–99)
Potassium: 4.5 mmol/L (ref 3.5–5.2)
Sodium: 138 mmol/L (ref 134–144)

## 2020-03-29 ENCOUNTER — Telehealth (HOSPITAL_COMMUNITY): Payer: Self-pay | Admitting: Emergency Medicine

## 2020-03-29 NOTE — Telephone Encounter (Signed)
Reaching out to patient to offer assistance regarding upcoming cardiac imaging study; pt verbalizes understanding of appt date/time, parking situation and where to check in, pre-test NPO status and medications ordered, and verified current allergies; name and call back number provided for further questions should they arise Raynold Blankenbaker RN Navigator Cardiac Imaging [facility] [phone] office [phone] cell 

## 2020-03-30 ENCOUNTER — Other Ambulatory Visit: Payer: Self-pay

## 2020-03-30 ENCOUNTER — Other Ambulatory Visit (HOSPITAL_COMMUNITY): Payer: Self-pay | Admitting: Emergency Medicine

## 2020-03-30 ENCOUNTER — Ambulatory Visit (HOSPITAL_COMMUNITY)
Admission: RE | Admit: 2020-03-30 | Discharge: 2020-03-30 | Disposition: A | Discharge status: Home / Self Care | Payer: Medicare Other | Source: Ambulatory Visit | Attending: Cardiology | Admitting: Cardiology

## 2020-03-30 DIAGNOSIS — I259 Chronic ischemic heart disease, unspecified: Secondary | ICD-10-CM | POA: Diagnosis not present

## 2020-03-30 DIAGNOSIS — Z794 Long term (current) use of insulin: Secondary | ICD-10-CM | POA: Diagnosis present

## 2020-03-30 DIAGNOSIS — E1129 Type 2 diabetes mellitus with other diabetic kidney complication: Secondary | ICD-10-CM | POA: Diagnosis present

## 2020-03-30 DIAGNOSIS — I25118 Atherosclerotic heart disease of native coronary artery with other forms of angina pectoris: Secondary | ICD-10-CM | POA: Insufficient documentation

## 2020-03-30 DIAGNOSIS — R809 Proteinuria, unspecified: Secondary | ICD-10-CM | POA: Insufficient documentation

## 2020-03-30 MED ORDER — NITROGLYCERIN 0.4 MG SL SUBL
SUBLINGUAL_TABLET | SUBLINGUAL | Status: AC
  Administered 2020-03-30: 0.8 mg via SUBLINGUAL
  Filled 2020-03-30: qty 2

## 2020-03-30 MED ORDER — METOPROLOL TARTRATE 5 MG/5ML IV SOLN
5.0000 mg | INTRAVENOUS | Status: DC | PRN
  Administered 2020-03-30: 5 mg via INTRAVENOUS

## 2020-03-30 MED ORDER — IOHEXOL 350 MG/ML SOLN
80.0000 mL | Freq: Once | INTRAVENOUS | Status: AC | PRN
  Administered 2020-03-30: 80 mL via INTRAVENOUS

## 2020-03-30 MED ORDER — NITROGLYCERIN 0.4 MG SL SUBL: 0.8000 mg | SUBLINGUAL_TABLET | SUBLINGUAL | Status: DC | PRN

## 2020-03-30 MED ORDER — METOPROLOL TARTRATE 5 MG/5ML IV SOLN
INTRAVENOUS | Status: AC
  Administered 2020-03-30: 5 mg via INTRAVENOUS
  Filled 2020-03-30: qty 15

## 2020-03-30 MED ORDER — DILTIAZEM HCL 25 MG/5ML IV SOLN
5.0000 mg | INTRAVENOUS | Status: DC | PRN
  Administered 2020-03-30 (×2): 5 mg via INTRAVENOUS
  Filled 2020-03-30 (×2): qty 5

## 2020-03-30 MED ORDER — DILTIAZEM HCL 25 MG/5ML IV SOLN
INTRAVENOUS | Status: AC
  Administered 2020-03-30: 5 mg via INTRAVENOUS
  Filled 2020-03-30: qty 5

## 2020-04-05 NOTE — Addendum Note (Signed)
Addended by: Eleonore Chiquito on: 04/05/2020 01:28 PM   Modules accepted: Orders

## 2020-04-23 DIAGNOSIS — Z2821 Immunization not carried out because of patient refusal: Secondary | ICD-10-CM

## 2020-04-23 HISTORY — DX: Immunization not carried out because of patient refusal: Z28.21

## 2020-05-05 ENCOUNTER — Other Ambulatory Visit: Payer: Self-pay

## 2020-05-06 ENCOUNTER — Ambulatory Visit (INDEPENDENT_AMBULATORY_CARE_PROVIDER_SITE_OTHER): Payer: Medicare Other | Admitting: Cardiology

## 2020-05-06 ENCOUNTER — Encounter: Payer: Self-pay | Admitting: Cardiology

## 2020-05-06 ENCOUNTER — Other Ambulatory Visit: Payer: Self-pay

## 2020-05-06 VITALS — BP 138/78 | HR 102 | Ht 73.0 in | Wt 268.0 lb

## 2020-05-06 DIAGNOSIS — E119 Type 2 diabetes mellitus without complications: Secondary | ICD-10-CM | POA: Diagnosis not present

## 2020-05-06 DIAGNOSIS — I2510 Atherosclerotic heart disease of native coronary artery without angina pectoris: Secondary | ICD-10-CM

## 2020-05-06 DIAGNOSIS — Z86718 Personal history of other venous thrombosis and embolism: Secondary | ICD-10-CM

## 2020-05-06 DIAGNOSIS — I25118 Atherosclerotic heart disease of native coronary artery with other forms of angina pectoris: Secondary | ICD-10-CM

## 2020-05-06 DIAGNOSIS — E782 Mixed hyperlipidemia: Secondary | ICD-10-CM | POA: Diagnosis not present

## 2020-05-06 DIAGNOSIS — Z86711 Personal history of pulmonary embolism: Secondary | ICD-10-CM

## 2020-05-06 NOTE — Patient Instructions (Signed)

## 2020-05-06 NOTE — Progress Notes (Signed)
Cardiology Office Note:    Date:  05/06/2020   ID:  Roberto Sosa, DOB 1960/06/29, MRN [id number]  PCP:  Olive Bass, MD  Cardiologist:  Garwin Brothers, MD   Referring MD: Olive Bass, MD    ASSESSMENT:    No diagnosis found. PLAN:    In order of problems listed above:  1. Coronary artery disease: Secondary prevention stressed with the patient.  Importance of compliance with diet medication stressed any vocalized understanding.  Coronary angiography report was discussed with him at length. 2. Essential hypertension: Blood pressure stable and diet was emphasized.  Salt intake issues and lifestyle modification was urged 3. Mixed dyslipidemia: Lipids were checked recently by his primary care and they are fine.  I reviewed them.  Diet was emphasized again. 4. Diabetes mellitus: Patient's blood sugars are still elevated and he is working with primary care provider to do this better. 5. Obesity: Weight reduction was stressed and the patient is trying to do better with diet and compliance with exercise to the best of his ability. 6. Patient will be seen in follow-up appointment in 6 months or earlier if the patient has any concerns    Medication Adjustments/Labs and Tests Ordered: Current medicines are reviewed at length with the patient today.  Concerns regarding medicines are outlined above.  No orders of the defined types were placed in this encounter.  No orders of the defined types were placed in this encounter.    No chief complaint on file.    History of Present Illness:    Roberto Sosa is a 60 y.o. male.  Patient has past medical history of coronary artery disease diagnosed by CT coronary angiography, essential hypertension, dyslipidemia diabetes mellitus and obesity.  He denies any problems at this time and takes care of activities of daily living.  No chest pain orthopnea or PND.  At the time of my evaluation, the patient is alert awake oriented and in no  distress.  Past Medical History:  Diagnosis Date  . Atherosclerosis of native coronary artery of native heart with stable angina pectoris (HCC) 06/09/2015  . Carbuncle of abdominal wall 12/29/2019   Formatting of this note might be different from the original. 12/29/2019: supraumbilical  . Coronary artery disease   . Diabetes mellitus without complication (HCC)   . DVT (deep venous thrombosis) (HCC) 03/07/2016  . Dyspnea 08/28/2017   2019: onset after PE  . High cholesterol   . History of deep venous thrombosis 03/07/2016   Overview:  2017: LLE, extensive, thrombectomy and 2 venous stents, no PE . Post-trauma. Xarelto  . History of pulmonary embolism 07/27/2017   2019: occurred off NOAC  . Left thigh pain 03/03/2016  . Long term (current) use of anticoagulants 11/17/2018  . Mixed hyperlipidemia 06/09/2015  . Moderate persistent asthma without complication 03/20/2019  . Nephrolithiasis 03/14/2016  . Type 2 diabetes mellitus with microalbuminuria, with long-term current use of insulin (HCC) 06/09/2015   2018: UACR=200  . Vaccination refused by patient 04/23/2020   Formatting of this note might be different from the original. 04/23/2020: all but Tdap, discussed    Past Surgical History:  Procedure Laterality Date  . CARDIAC CATHETERIZATION Left 03/09/2016   Procedure: Intravascular Ultrasound/IVUS;  Surgeon: Fransisco Hertz, MD;  Location: [facility] INVASIVE CV LAB;  Service: Cardiovascular;  Laterality: Left;  IVC-LT COMMON ILIAC VEIN-LT EXR ILIAC VEIN-LT DEEP FEMORAL/POPLITEAL VEIN LYSIS AND THROMBECTOMY  . CARDIAC CATHETERIZATION Left 03/10/2016   Procedure: Intravascular Ultrasound/IVUS;  Surgeon:  Sherren Kerns, MD;  Location: [facility] INVASIVE CV LAB;  Service: Cardiovascular;  Laterality: Left;  Lower extremity venous.  Marland Kitchen CATARACT EXTRACTION Bilateral 02/2019  . HUMERUS FRACTURE SURGERY Left   . ORTHOPEDIC SURGERY Bilateral 06/2006   multiple fractures and breaks  . PERIPHERAL VASCULAR  CATHETERIZATION Left 03/09/2016   Procedure: Lower Extremity Venography;  Surgeon: Fransisco Hertz, MD;  Location: [facility] INVASIVE CV LAB;  Service: Cardiovascular;  Laterality: Left;  . PERIPHERAL VASCULAR CATHETERIZATION N/A 03/09/2016   Procedure: IVC Venography;  Surgeon: Fransisco Hertz, MD;  Location: [facility] INVASIVE CV LAB;  Service: Cardiovascular;  Laterality: N/A;  . PERIPHERAL VASCULAR CATHETERIZATION Left 03/10/2016   Procedure: Lower Extremity Venography;  Surgeon: Sherren Kerns, MD;  Location: [facility] INVASIVE CV LAB;  Service: Cardiovascular;  Laterality: Left;  . PERIPHERAL VASCULAR CATHETERIZATION Left 03/10/2016   Procedure: Peripheral Vascular Intervention;  Surgeon: Sherren Kerns, MD;  Location: [facility] INVASIVE CV LAB;  Service: Cardiovascular;  Laterality: Left;  Left common and external iliac vein.    Marland Kitchen SHOULDER ARTHROSCOPY Right     Current Medications: Current Meds  Medication Sig  . budesonide-formoterol (SYMBICORT) 160-4.5 MCG/ACT inhaler Inhale 2 puffs into the lungs 2 (two) times daily.  . carvedilol (COREG) 25 MG tablet Take 25 mg by mouth 2 (two) times daily with a meal.  . Dulaglutide (TRULICITY) 3 MG/0.5ML SOPN Inject 3 mg into the skin once a week.  Marland Kitchen ELIQUIS 5 MG TABS tablet Take 5 mg by mouth 2 (two) times daily.   Marland Kitchen ezetimibe (ZETIA) 10 MG tablet Take 10 mg by mouth every morning.  Marland Kitchen FARXIGA 10 MG TABS tablet Take 10 mg by mouth every morning.  . gabapentin (NEURONTIN) 600 MG tablet Take 600 mg by mouth every morning.   . insulin degludec (TRESIBA) 200 UNIT/ML FlexTouch Pen Inject 86 Units into the skin daily.  . metFORMIN (GLUCOPHAGE) 1000 MG tablet Take 1,000 mg by mouth 2 (two) times daily with a meal.  . montelukast (SINGULAIR) 10 MG tablet Take 1 tablet (10 mg total) by mouth at bedtime.  . nitroGLYCERIN (NITROSTAT) 0.4 MG SL tablet Place 1 tablet (0.4 mg total) under the tongue every 5 (five) minutes as needed for chest pain.  . rosuvastatin (CRESTOR) 40 MG tablet  Take 40 mg by mouth every evening.      Allergies:   Patient has no known allergies.   Social History   Socioeconomic History  . Marital status: Widowed    Spouse name: Not on file  . Number of children: Not on file  . Years of education: Not on file  . Highest education level: Not on file  Occupational History  . 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 Smoking status: Former Smoker    Packs/day: 2.00    Years: 20.00    Pack years: 40.00    Types: Cigarettes, Pipe, Cigars    Quit date: 03/20/1990    Years since quitting: 30.1  . Smokeless tobacco: Current User    Types: Chew  Substance and Sexual Activity  . Alcohol use: Yes    Comment: 2 beers every 2 months  . Drug use: No  . Sexual activity: Not Currently  Other Topics Concern  . Not on file  Social History Narrative  . Not on file   Social Determinants of Health   Financial Resource Strain: Not on file  Food Insecurity: Not on file  Transportation Needs: Not on file  Physical Activity: Not on file  Stress: Not on file  Social Connections: Not on file     Family History: The patient's family history is not on file. He was adopted.  ROS:   Please see the history of present illness.    All other systems reviewed and are negative.  EKGs/Labs/Other Studies Reviewed:    The following studies were reviewed today: HISTORY: Chest pain/anginal equiv, ECGs and troponins normal Dyspnea on exertion (DOE)  EXAM: Cardiac/Coronary CT  TECHNIQUE: The patient was scanned on a [employer].  PROTOCOL: A 130 kV prospective scan was triggered in the descending thoracic aorta at 111 HU's. Axial non-contrast 3 mm slices were carried out through the heart. The data set was analyzed on a dedicated work station and scored using the Agatson method. Gantry rotation speed was 250 msecs and collimation was 0.6 mm. Beta blockade and 0.8 mg of sl NTG was given.  The 3D data set was reconstructed in 5% intervals of 35-75% of the R-R cycle. Diastolic phases were analyzed on a dedicated work station using MPR, MIP and VRT modes. The patient received 51mL OMNIPAQUE IOHEXOL 350 MG/ML SOLN of contrast.  FINDINGS: Coronary calcium score: The patient's coronary artery calcium score is 77, which places the patient in the 54th percentile.  Coronary arteries: Normal coronary origins.  Left dominance.  Right Coronary Artery: Small caliber vessel, short course, nondominant. No significant plaque or stenosis.  Left Main Coronary Artery: Normal caliber vessel. There is a small amount of calcified plaque with 1-24% stenosis in the proximal portion. There is a ramus intermedius  Left Anterior Descending Coronary Artery: Normal caliber vessel. There is mixed calcified and noncalcified plaque in the proximal LAD with 1-24% stenosis. Gives rise to normal first diagonal branch.  Left Circumflex Artery: Normal caliber vessel. There is mixed calcified and noncalcified plaque in the proximal to mid portion with 1-24% stenosis. Gives rise to 2 OM branches. Gives rise to PDA.  Aorta: Normal size, 37 mm at the mid ascending aorta (level of the PA bifurcation) measured double oblique. No calcifications. No dissection.  Aortic Valve: No calcifications. Trileaflet.  Other findings:  Normal pulmonary vein drainage into the left atrium.  Normal left atrial appendage without a thrombus.  Normal size of the pulmonary artery.  IMPRESSION: 1.  Minimal nonobstructive CAD, CADRADS = 1.  2. Coronary calcium score of 77. This was 54th percentile for age and sex matched control.  3. Normal coronary origin with left dominance.   Electronically Signed   By: Jodelle Red M.D.   On: 03/30/2020 16:49   Recent Labs: 08/04/2019: Hemoglobin 14.5; Platelets 202.0; Pro B Natriuretic peptide (BNP) 30.0 03/23/2020: BUN 14; Creatinine, Ser 1.13;  Potassium 4.5; Sodium 138  Recent Lipid Panel No results found for: CHOL, TRIG, HDL, CHOLHDL, VLDL, LDLCALC, LDLDIRECT  Physical Exam:    VS:  BP 138/78   Pulse (!) 102   Ht 6\' 1"  (1.854 m)   Wt 268 lb (121.6 kg)   SpO2 98%   BMI 35.36 kg/m     Wt Readings from Last 3 Encounters:  05/06/20 268 lb (121.6 kg)  03/04/20 267 lb 6.4 oz (121.3 kg)  09/02/19 274 lb (124.3 kg)     GEN: Patient is in no acute distress HEENT: Normal NECK: No JVD; No carotid bruits LYMPHATICS: No lymphadenopathy CARDIAC: Hear sounds regular, 2/6 systolic murmur at the apex. RESPIRATORY:  Clear to auscultation without rales, wheezing or  rhonchi  ABDOMEN: Soft, non-tender, non-distended MUSCULOSKELETAL:  No edema; No deformity  SKIN: Warm and dry NEUROLOGIC:  Alert and oriented x 3 PSYCHIATRIC:  Normal affect   Signed, Garwin Brothersajan R Cadell Gabrielson, MD  05/06/2020 11:16 AM    [facility]

## 2020-06-08 ENCOUNTER — Other Ambulatory Visit: Payer: Self-pay | Admitting: Cardiology

## 2020-06-09 ENCOUNTER — Other Ambulatory Visit: Payer: Self-pay

## 2020-06-09 MED ORDER — NITROGLYCERIN 0.4 MG SL SUBL: 0.4000 mg | SUBLINGUAL_TABLET | SUBLINGUAL | 4 refills | Status: DC | PRN

## 2020-06-09 NOTE — Telephone Encounter (Signed)
Refill sent to pharmacy.   

## 2020-08-11 ENCOUNTER — Ambulatory Visit (HOSPITAL_COMMUNITY)
Admission: RE | Admit: 2020-08-11 | Discharge: 2020-08-11 | Disposition: A | Discharge status: Home / Self Care | Payer: Medicare Other | Source: Ambulatory Visit | Attending: Vascular Surgery | Admitting: Vascular Surgery

## 2020-08-11 ENCOUNTER — Other Ambulatory Visit: Payer: Self-pay

## 2020-08-11 ENCOUNTER — Ambulatory Visit (INDEPENDENT_AMBULATORY_CARE_PROVIDER_SITE_OTHER): Payer: Medicare Other | Admitting: Physician Assistant

## 2020-08-11 VITALS — BP 104/71 | HR 83 | Temp 97.9°F | Resp 20 | Ht 73.0 in | Wt 266.1 lb

## 2020-08-11 DIAGNOSIS — I82422 Acute embolism and thrombosis of left iliac vein: Secondary | ICD-10-CM

## 2020-08-11 DIAGNOSIS — Z86718 Personal history of other venous thrombosis and embolism: Secondary | ICD-10-CM | POA: Insufficient documentation

## 2020-08-11 DIAGNOSIS — Z86711 Personal history of pulmonary embolism: Secondary | ICD-10-CM

## 2020-08-11 NOTE — Progress Notes (Signed)
[facility]      History of Present Illness  Roberto Sosa is a 60 y.o. male who underwent stenting of his left CIV and EIV in December 2017 by Dr. Darrick Penna and Dr. Imogene Burn after having extensive DVT.  He was taking Xarelto and switched to Eliquis.  At his last visit, he was doing well without swelling in his LLE.    He is here today for follow up.  He states that he is not having any leg swelling and doing well from that standpoint.  He states that sometimes at night he feels like the skin is being ripped off his toes. He takes gabapentin.  He does have some shortness of breath that he states is from blood clots in his lungs.  He continues to take his Eliquis and is followed by his PCP in [place].     He is here today for f/u duplex of IVC/iliac left.  He denise LE recurrent edema.  He continues to have SOB and is followed by a Pulmonologist.     The pt is on a statin for cholesterol management.  The pt is diabetic.   The pt is on a BB for hypertension.   Tobacco hx:  Remote-quit 2001; +for smokeless tobacco The pt is on a daily aspirin.  He is on Eliquis  Past Medical History:  Diagnosis Date  . Atherosclerosis of native coronary artery of native heart with stable angina pectoris (HCC) 06/09/2015  . Carbuncle of abdominal wall 12/29/2019   Formatting of this note might be different from the original. 12/29/2019: supraumbilical  . Coronary artery disease   . Diabetes mellitus without complication (HCC)   . DVT (deep venous thrombosis) (HCC) 03/07/2016  . Dyspnea 08/28/2017   2019: onset after PE  . High cholesterol   . History of deep venous thrombosis 03/07/2016   Overview:  2017: LLE, extensive, thrombectomy and 2 venous stents, no PE . Post-trauma. Xarelto  . History of pulmonary embolism 07/27/2017   2019: occurred off NOAC  . Left thigh pain 03/03/2016  . Long term (current) use of anticoagulants 11/17/2018  . Mixed hyperlipidemia 06/09/2015  .  Moderate persistent asthma without complication 03/20/2019  . Nephrolithiasis 03/14/2016  . Type 2 diabetes mellitus with microalbuminuria, with long-term current use of insulin (HCC) 06/09/2015   2018: UACR=200  . Vaccination refused by patient 04/23/2020   Formatting of this note might be different from the original. 04/23/2020: all but Tdap, discussed    Past Surgical History:  Procedure Laterality Date  . CARDIAC CATHETERIZATION Left 03/09/2016   Procedure: Intravascular Ultrasound/IVUS;  Surgeon: Fransisco Hertz, MD;  Location: [facility] INVASIVE CV LAB;  Service: Cardiovascular;  Laterality: Left;  IVC-LT COMMON ILIAC VEIN-LT EXR ILIAC VEIN-LT DEEP FEMORAL/POPLITEAL VEIN LYSIS AND THROMBECTOMY  . CARDIAC CATHETERIZATION Left 03/10/2016   Procedure: Intravascular Ultrasound/IVUS;  Surgeon: Sherren Kerns, MD;  Location: [facility] INVASIVE CV LAB;  Service: Cardiovascular;  Laterality: Left;  Lower extremity venous.  Marland Kitchen CATARACT EXTRACTION Bilateral 02/2019  . HUMERUS FRACTURE SURGERY Left   . ORTHOPEDIC SURGERY Bilateral 06/2006   multiple fractures and breaks  . PERIPHERAL VASCULAR CATHETERIZATION Left 03/09/2016   Procedure: Lower Extremity Venography;  Surgeon: Fransisco Hertz, MD;  Location: [facility] INVASIVE CV LAB;  Service: Cardiovascular;  Laterality: Left;  . PERIPHERAL VASCULAR CATHETERIZATION N/A 03/09/2016   Procedure: IVC Venography;  Surgeon: Fransisco Hertz, MD;  Location: [facility] INVASIVE CV LAB;  Service: Cardiovascular;  Laterality: N/A;  .  PERIPHERAL VASCULAR CATHETERIZATION Left 03/10/2016   Procedure: Lower Extremity Venography;  Surgeon: Sherren Kernsharles E Fields, MD;  [facility];  Service: Cardiovascular;  Laterality: Left;  . PERIPHERAL VASCULAR CATHETERIZATION Left 03/10/2016   Procedure: Peripheral Vascular Intervention;  Surgeon: Sherren Kernsharles E Fields, MD;  [facility];  Service: Cardiovascular;  Laterality: Left;  Left common and external iliac vein.    Marland Kitchen. SHOULDER  ARTHROSCOPY Right     Social History   Socioeconomic History  . Marital status: Widowed    Spouse name: Not on file  . Number of children: Not on file  . Years of education: Not on file  . Highest education level: Not on file  Occupational History  . Occupation: "Human resources officerloater"    Employer: [employer]    Comment: Media plannerMachinery operator  Tobacco Use  . Smoking status: Former Smoker    Packs/day: 2.00    Years: 20.00    Pack years: 40.00    Types: Cigarettes, Pipe, Cigars    Quit date: 03/20/1990    Years since quitting: 30.4  . Smokeless tobacco: Current User    Types: Chew  Substance and Sexual Activity  . Alcohol use: Yes    Comment: 2 beers every 2 months  . Drug use: No  . Sexual activity: Not Currently  Other Topics Concern  . Not on file  Social History Narrative  . Not on file   Social Determinants of Health   Financial Resource Strain: Not on file  Food Insecurity: Not on file  Transportation Needs: Not on file  Physical Activity: Not on file  Stress: Not on file  Social Connections: Not on file  Intimate Partner Violence: Not on file    Family History  Adopted: Yes    Current Outpatient Medications on File Prior to Visit  Medication Sig Dispense Refill  . albuterol (VENTOLIN HFA) 108 (90 Base) MCG/ACT inhaler Inhale 1-2 puffs into the lungs every 4 (four) hours as needed for wheezing or shortness of breath. 8 g 3  . budesonide-formoterol (SYMBICORT) 160-4.5 MCG/ACT inhaler Inhale 2 puffs into the lungs 2 (two) times daily. 1 Inhaler 2  . carvedilol (COREG) 25 MG tablet Take 25 mg by mouth 2 (two) times daily with a meal.    . Dulaglutide (TRULICITY) 3 MG/0.5ML SOPN Inject 3 mg into the skin once a week.    Marland Kitchen. ELIQUIS 5 MG TABS tablet Take 5 mg by mouth 2 (two) times daily.   1  . ezetimibe (ZETIA) 10 MG tablet Take 10 mg by mouth every morning.    Marland Kitchen. FARXIGA 10 MG TABS tablet Take 10 mg by mouth every morning.    . gabapentin (NEURONTIN) 600 MG tablet Take 600 mg  by mouth every morning.     . insulin degludec (TRESIBA) 200 UNIT/ML FlexTouch Pen Inject 86 Units into the skin daily.    . metFORMIN (GLUCOPHAGE) 1000 MG tablet Take 1,000 mg by mouth 2 (two) times daily with a meal.    . montelukast (SINGULAIR) 10 MG tablet Take 1 tablet (10 mg total) by mouth at bedtime. 30 tablet 6  . nitroGLYCERIN (NITROSTAT) 0.4 MG SL tablet DISSOLVE ONE TABLET UNDER THE TONGUE EVERY 5 MINUTES AS NEEDED FOR CHEST PAIN.  DO NOT EXCEED A TOTAL OF 3 DOSES IN 15 MINUTES 25 tablet 0  . rosuvastatin (CRESTOR) 40 MG tablet Take 40 mg by mouth every evening.      No current facility-administered medications on file prior to  visit.    Allergies as of 08/11/2020  . (No Known Allergies)     ROS:   General:  No weight loss, Fever, chills  HEENT: No recent headaches, no nasal bleeding, no visual changes, no sore throat  Neurologic: No dizziness, blackouts, seizures. No recent symptoms of stroke or mini- stroke. No recent episodes of slurred speech, or temporary blindness.  Cardiac: No recent episodes of chest pain/pressure, + shortness of breath at rest.  + shortness of breath with exertion.  Denies history of atrial fibrillation or irregular heartbeat  Vascular: No history of rest pain in feet.  No history of claudication.  No history of non-healing ulcer, + history of DVT?PE  Pulmonary: No home oxygen, no productive cough, no hemoptysis,  No asthma or wheezing  Musculoskeletal:  [ ]  Arthritis, [x ] Low back pain,  [ ]  Joint pain  Hematologic:No history of hypercoagulable state.  No history of easy bleeding.  No history of anemia  Gastrointestinal: No hematochezia or melena,  No gastroesophageal reflux, no trouble swallowing  Urinary: [ ]  chronic Kidney disease, [ ]  on HD - [ ]  MWF or [ ]  TTHS, [ ]  Burning with urination, [ ]  Frequent urination, [ ]  Difficulty urinating;   Skin: No rashes  Psychological: No history of anxiety,  No history of depression  Physical  Examination  Vitals:   08/11/20 0948  BP: 104/71  Pulse: 83  Resp: 20  Temp: 97.9 F (36.6 C)  TempSrc: Temporal  SpO2: 99%  Weight: 266 lb 1.6 oz (120.7 kg)  Height: 6\' 1"  (1.854 m)    Body mass index is 35.11 kg/m.  General:  Alert and oriented, no acute distress Neck: No bruit or JVD Pulmonary: Clear to auscultation bilaterally Cardiac: Regular Rate and Rhythm without murmur Abdomen: Soft, non-tender, non-distended, no mass, no scars Skin: No rash Extremity Pulses:  2+ radial, brachial, femoral, dorsalis pedis pulses bilaterally Musculoskeletal: No deformity or edema  Neurologic: Upper and lower extremity motor 5/5 and symmetric  DATA:    IVC/Iliac Findings:  +----------+------+--------+--------+    IVC  PatentThrombusComments  +----------+------+--------+--------+  IVC Prox patent          +----------+------+--------+--------+  IVC Mid  patent          +----------+------+--------+--------+  IVC Distalpatent          +----------+------+--------+--------+     +-------------------+---------+-----------+--------+      CIV    LT-PatentLT-ThrombusComments  +-------------------+---------+-----------+--------+  Common Iliac Prox  patent             +-------------------+---------+-----------+--------+  Common Iliac Mid   patent             +-------------------+---------+-----------+--------+  Common Iliac Distal patent             +-------------------+---------+-----------+--------+      +--------------------------+---------+-----------+--------+        EIV      LT-PatentLT-ThrombusComments  +--------------------------+---------+-----------+--------+  External Iliac Vein Prox  patent             +--------------------------+---------+-----------+--------+  External Iliac Vein Mid   patent              +--------------------------+---------+-----------+--------+  External Iliac Vein Distal patent             +--------------------------+---------+-----------+--------+      Assessment: S/P stenting of his left CIV and EIV in December 2017 by Dr. and Dr. after having extensive DVT.   IVC/Iliac: No evidence of thrombus in IVC and left Iliac veins.  Patent  Common iliac/ External iliac stent.   Plan:  He will continue life long anticoagulation that is managed by his PCP.  He is on Eliquis due to cost of Xarelto. -he will f/u in one year with repeat duplex.  He will call sooner should he have any issues.   Mosetta Pigeon PA-C [facility] [place] Office: [phone]  MD in clinic [employer]

## 2020-11-04 ENCOUNTER — Ambulatory Visit (INDEPENDENT_AMBULATORY_CARE_PROVIDER_SITE_OTHER): Payer: Medicare Other | Admitting: Cardiology

## 2020-11-04 ENCOUNTER — Encounter: Payer: Self-pay | Admitting: Cardiology

## 2020-11-04 ENCOUNTER — Other Ambulatory Visit: Payer: Self-pay

## 2020-11-04 VITALS — BP 132/70 | HR 85 | Ht 73.0 in | Wt 267.0 lb

## 2020-11-04 DIAGNOSIS — Z86718 Personal history of other venous thrombosis and embolism: Secondary | ICD-10-CM

## 2020-11-04 DIAGNOSIS — Z7901 Long term (current) use of anticoagulants: Secondary | ICD-10-CM | POA: Diagnosis not present

## 2020-11-04 DIAGNOSIS — E119 Type 2 diabetes mellitus without complications: Secondary | ICD-10-CM

## 2020-11-04 DIAGNOSIS — E1129 Type 2 diabetes mellitus with other diabetic kidney complication: Secondary | ICD-10-CM | POA: Diagnosis not present

## 2020-11-04 DIAGNOSIS — Z86711 Personal history of pulmonary embolism: Secondary | ICD-10-CM | POA: Diagnosis not present

## 2020-11-04 DIAGNOSIS — I25118 Atherosclerotic heart disease of native coronary artery with other forms of angina pectoris: Secondary | ICD-10-CM

## 2020-11-04 DIAGNOSIS — Z794 Long term (current) use of insulin: Secondary | ICD-10-CM

## 2020-11-04 DIAGNOSIS — R809 Proteinuria, unspecified: Secondary | ICD-10-CM

## 2020-11-04 DIAGNOSIS — E782 Mixed hyperlipidemia: Secondary | ICD-10-CM

## 2020-11-04 NOTE — Patient Instructions (Signed)

## 2020-11-04 NOTE — Progress Notes (Signed)
Cardiology Office Note:    Date:  11/04/2020   ID:  Roberto RenJeffrey Ezelle, DOB [birth year], MRN [id number]  PCP:  Olive Bassough, Robert L, MD  Cardiologist:  Garwin Brothersajan R Arnika Larzelere, MD   Referring MD: Olive Bassough, Robert L, MD    ASSESSMENT:    1. Atherosclerosis of native coronary artery of native heart with stable angina pectoris (HCC)   2. Type 2 diabetes mellitus with microalbuminuria, with long-term current use of insulin (HCC)   3. Long term (current) use of anticoagulants   4. History of pulmonary embolism   5. Diabetes mellitus without complication (HCC)   6. History of deep venous thrombosis   7. Mixed hyperlipidemia    PLAN:    In order of problems listed above:  Coronary artery disease: Secondary prevention stressed with the patient.  Importance of compliance with diet medication stressed any vocalized understanding.  He was advised to ambulate on a regular basis. Essential hypertension: Blood pressure stable and diet was emphasized.  Lifestyle modification urged. Mixed dyslipidemia: Patient is on lipid-lowering therapy and [facility].  Diet emphasized. Diabetes mellitus and obesity: I explained lifestyle modification and risk to the patient at length and he promises to do better with diet and exercise. He is fasting and will have complete blood work today.Patient will be seen in follow-up appointment in 6 months or earlier if the patient has any concerns   Medication Adjustments/Labs and Tests Ordered: Current medicines are reviewed at length with the patient today.  Concerns regarding medicines are outlined above.  No orders of the defined types were placed in this encounter.  No orders of the defined types were placed in this encounter.    Chief Complaint  Patient presents with   Follow-up     History of Present Illness:    Roberto Sosa is a 60 y.o. male.  Patient has past medical history of nonobstructive coronary artery disease, essential hypertension,  dyslipidemia diabetes mellitus and obesity.  He denies any problems at this time and takes care of activities of daily living.  He has persistent shortness of breath on exertion this is managed by his primary care.  At the time of my evaluation, the patient is alert awake oriented and in no distress.  Past Medical History:  Diagnosis Date   Atherosclerosis of native coronary artery of native heart with stable angina pectoris (HCC) 06/09/2015   Carbuncle of abdominal wall 12/29/2019   Formatting of this note might be different from the original. 12/29/2019: supraumbilical   Coronary artery disease    Diabetes mellitus without complication (HCC)    DVT (deep venous thrombosis) (HCC) 03/07/2016   Dyspnea 08/28/2017   2019: onset after PE   High cholesterol    History of deep venous thrombosis 03/07/2016   Overview:  2017: LLE, extensive, thrombectomy and 2 venous stents, no PE . Post-trauma. Xarelto   History of pulmonary embolism 07/27/2017   2019: occurred off NOAC   Left thigh pain 03/03/2016   Long term (current) use of anticoagulants 11/17/2018   Mixed hyperlipidemia 06/09/2015   Moderate persistent asthma without complication 03/20/2019   Nephrolithiasis 03/14/2016   Type 2 diabetes mellitus with microalbuminuria, with long-term current use of insulin (HCC) 06/09/2015   2018: UACR=200   Vaccination refused by patient 04/23/2020   Formatting of this note might be different from the original. 04/23/2020: all but Tdap, discussed    Past Surgical History:  Procedure Laterality Date   CARDIAC CATHETERIZATION Left 03/09/2016  Procedure: Intravascular Ultrasound/IVUS;  Surgeon: Fransisco Hertz, MD;  Location: [facility] INVASIVE CV LAB;  Service: Cardiovascular;  Laterality: Left;  IVC-LT COMMON ILIAC VEIN-LT EXR ILIAC VEIN-LT DEEP FEMORAL/POPLITEAL VEIN LYSIS AND THROMBECTOMY   CARDIAC CATHETERIZATION Left 03/10/2016   Procedure: Intravascular Ultrasound/IVUS;  Surgeon: Sherren Kerns, MD;  Location: [facility]  INVASIVE CV LAB;  Service: Cardiovascular;  Laterality: Left;  Lower extremity venous.   CATARACT EXTRACTION Bilateral 02/2019   HUMERUS FRACTURE SURGERY Left    ORTHOPEDIC SURGERY Bilateral 06/2006   multiple fractures and breaks   PERIPHERAL VASCULAR CATHETERIZATION Left 03/09/2016   Procedure: Lower Extremity Venography;  Surgeon: Fransisco Hertz, MD;  Location: [facility] INVASIVE CV LAB;  Service: Cardiovascular;  Laterality: Left;   PERIPHERAL VASCULAR CATHETERIZATION N/A 03/09/2016   Procedure: IVC Venography;  Surgeon: Fransisco Hertz, MD;  Location: [facility] INVASIVE CV LAB;  Service: Cardiovascular;  Laterality: N/A;   PERIPHERAL VASCULAR CATHETERIZATION Left 03/10/2016   Procedure: Lower Extremity Venography;  Surgeon: Sherren Kerns, MD;  Location: [facility] INVASIVE CV LAB;  Service: Cardiovascular;  Laterality: Left;   PERIPHERAL VASCULAR CATHETERIZATION Left 03/10/2016   Procedure: Peripheral Vascular Intervention;  Surgeon: Sherren Kerns, MD;  Location: [facility] INVASIVE CV LAB;  Service: Cardiovascular;  Laterality: Left;  Left common and external iliac vein.     SHOULDER ARTHROSCOPY Right     Current Medications: Current Meds  Medication Sig   albuterol (VENTOLIN HFA) 108 (90 Base) MCG/ACT inhaler Inhale 1-2 puffs into the lungs every 4 (four) hours as needed for wheezing or shortness of breath.   budesonide-formoterol (SYMBICORT) 160-4.5 MCG/ACT inhaler Inhale 2 puffs into the lungs 2 (two) times daily.   carvedilol (COREG) 25 MG tablet Take 25 mg by mouth 2 (two) times daily with a meal.   Dulaglutide (TRULICITY) 3 MG/0.5ML SOPN Inject 3 mg into the skin once a week.   ELIQUIS 5 MG TABS tablet Take 5 mg by mouth 2 (two) times daily.    ezetimibe (ZETIA) 10 MG tablet Take 10 mg by mouth every morning.   FARXIGA 10 MG TABS tablet Take 10 mg by mouth every morning.   gabapentin (NEURONTIN) 600 MG tablet Take 600 mg by mouth every morning.    insulin degludec (TRESIBA) 200 UNIT/ML FlexTouch Pen  Inject 86 Units into the skin daily.   metFORMIN (GLUCOPHAGE) 1000 MG tablet Take 1,000 mg by mouth 2 (two) times daily with a meal.   montelukast (SINGULAIR) 10 MG tablet Take 1 tablet (10 mg total) by mouth at bedtime.   nitroGLYCERIN (NITROSTAT) 0.4 MG SL tablet DISSOLVE ONE TABLET UNDER THE TONGUE EVERY 5 MINUTES AS NEEDED FOR CHEST PAIN.  DO NOT EXCEED A TOTAL OF 3 DOSES IN 15 MINUTES   rosuvastatin (CRESTOR) 40 MG tablet Take 40 mg by mouth every evening.      Allergies:   Patient has no known allergies.   Social History   Socioeconomic History   Marital status: Widowed    Spouse name: Not on file   Number of children: Not on file   Years of education: Not on file   Highest education level: Not on file  Occupational History   Occupation: "Floater"    Employer: [employer]    Comment: Media planner  Tobacco Use   Smoking status: Former    Packs/day: 2.00    Years: 20.00    Pack years: 40.00    Types: Cigarettes, Pipe, Cigars    Quit date: 03/20/1990    Years  since quitting: 30.6   Smokeless tobacco: Current    Types: Chew  Substance and Sexual Activity   Alcohol use: Yes    Comment: 2 beers every 2 months   Drug use: No   Sexual activity: Not Currently  Other Topics Concern   Not on file  Social History Narrative   Not on file   Social Determinants of Health   Financial Resource Strain: Not on file  Food Insecurity: Not on file  Transportation Needs: Not on file  Physical Activity: Not on file  Stress: Not on file  Social Connections: Not on file     Family History: The patient's family history is not on file. He was adopted.  ROS:   Please see the history of present illness.    All other systems reviewed and are negative.  EKGs/Labs/Other Studies Reviewed:    The following studies were reviewed today: HISTORY: Chest pain/anginal equiv, ECGs and troponins normal Dyspnea on exertion (DOE)   EXAM: Cardiac/Coronary CT   TECHNIQUE: The patient  was scanned on a [employer].   PROTOCOL: A 130 kV prospective scan was triggered in the descending thoracic aorta at 111 HU's. Axial non-contrast 3 mm slices were carried out through the heart. The data set was analyzed on a dedicated work station and scored using the Agatson method. Gantry rotation speed was 250 msecs and collimation was 0.6 mm. Beta blockade and 0.8 mg of sl NTG was given. The 3D data set was reconstructed in 5% intervals of 35-75% of the R-R cycle. Diastolic phases were analyzed on a dedicated work station using MPR, MIP and VRT modes. The patient received 77mL OMNIPAQUE IOHEXOL 350 MG/ML SOLN of contrast.   FINDINGS: Coronary calcium score: The patient's coronary artery calcium score is 77, which places the patient in the 54th percentile.   Coronary arteries: Normal coronary origins.  Left dominance.   Right Coronary Artery: Small caliber vessel, short course, nondominant. No significant plaque or stenosis.   Left Main Coronary Artery: Normal caliber vessel. There is a small amount of calcified plaque with 1-24% stenosis in the proximal portion. There is a ramus intermedius   Left Anterior Descending Coronary Artery: Normal caliber vessel. There is mixed calcified and noncalcified plaque in the proximal LAD with 1-24% stenosis. Gives rise to normal first diagonal branch.   Left Circumflex Artery: Normal caliber vessel. There is mixed calcified and noncalcified plaque in the proximal to mid portion with 1-24% stenosis. Gives rise to 2 OM branches. Gives rise to PDA.   Aorta: Normal size, 37 mm at the mid ascending aorta (level of the PA bifurcation) measured double oblique. No calcifications. No dissection.   Aortic Valve: No calcifications. Trileaflet.   Other findings:   Normal pulmonary vein drainage into the left atrium.   Normal left atrial appendage without a thrombus.   Normal size of the pulmonary artery.   IMPRESSION: 1.   Minimal nonobstructive CAD, CADRADS = 1.   2. Coronary calcium score of 77. This was 54th percentile for age and sex matched control.   3. Normal coronary origin with left dominance.     Electronically Signed   By: Jodelle Red M.D.   On: 03/30/2020 16:49     Recent Labs: 03/23/2020: BUN 14; Creatinine, Ser 1.13; Potassium 4.5; Sodium 138  Recent Lipid Panel No results found for: CHOL, TRIG, HDL, CHOLHDL, VLDL, LDLCALC, LDLDIRECT  Physical Exam:    VS:  BP 132/70 (BP Location: Left Arm, Patient Position:  Sitting, Cuff Size: Normal)   Pulse 85   Ht 6\' 1"  (1.854 m)   Wt 267 lb (121.1 kg)   SpO2 98%   BMI 35.23 kg/m     Wt Readings from Last 3 Encounters:  11/04/20 267 lb (121.1 kg)  08/11/20 266 lb 1.6 oz (120.7 kg)  05/06/20 268 lb (121.6 kg)     GEN: Patient is in no acute distress HEENT: Normal NECK: No JVD; No carotid bruits LYMPHATICS: No lymphadenopathy CARDIAC: Hear sounds regular, 2/6 systolic murmur at the apex. RESPIRATORY:  Clear to auscultation without rales, wheezing or rhonchi  ABDOMEN: Soft, non-tender, non-distended MUSCULOSKELETAL:  No edema; No deformity  SKIN: Warm and dry NEUROLOGIC:  Alert and oriented x 3 PSYCHIATRIC:  Normal affect   Signed, 05/08/20, MD  11/04/2020 11:43 AM    [facility]

## 2020-11-05 LAB — BASIC METABOLIC PANEL
BUN/Creatinine Ratio: 6 — ABNORMAL LOW (ref 10–24)
BUN: 6 mg/dL — ABNORMAL LOW (ref 8–27)
CO2: 23 mmol/L (ref 20–29)
Calcium: 9.4 mg/dL (ref 8.6–10.2)
Chloride: 103 mmol/L (ref 96–106)
Creatinine, Ser: 0.93 mg/dL (ref 0.76–1.27)
Glucose: 304 mg/dL — ABNORMAL HIGH (ref 65–99)
Potassium: 4.3 mmol/L (ref 3.5–5.2)
Sodium: 141 mmol/L (ref 134–144)
eGFR: 94 mL/min/{1.73_m2} (ref >59)

## 2020-11-05 LAB — HEPATIC FUNCTION PANEL
ALT: 21 IU/L (ref 0–44)
AST: 15 IU/L (ref 0–40)
Albumin: 4.5 g/dL (ref 3.8–4.9)
Alkaline Phosphatase: 88 IU/L (ref 44–121)
Bilirubin Total: 1 mg/dL (ref 0.0–1.2)
Bilirubin, Direct: 0.24 mg/dL (ref 0.00–0.40)
Total Protein: 6.7 g/dL (ref 6.0–8.5)

## 2020-11-05 LAB — CBC WITH DIFFERENTIAL/PLATELET
Basophils Absolute: 0.1 10*3/uL (ref 0.0–0.2)
Basos: 1 %
EOS (ABSOLUTE): 0.3 10*3/uL (ref 0.0–0.4)
Eos: 4 %
Hematocrit: 45.5 % (ref 37.5–51.0)
Hemoglobin: 15.6 g/dL (ref 13.0–17.7)
Immature Grans (Abs): 0 10*3/uL (ref 0.0–0.1)
Immature Granulocytes: 0 %
Lymphocytes Absolute: 2.1 10*3/uL (ref 0.7–3.1)
Lymphs: 22 %
MCH: 29.4 pg (ref 26.6–33.0)
MCHC: 34.3 g/dL (ref 31.5–35.7)
MCV: 86 fL (ref 79–97)
Monocytes Absolute: 0.5 10*3/uL (ref 0.1–0.9)
Monocytes: 6 %
Neutrophils Absolute: 6.6 10*3/uL (ref 1.4–7.0)
Neutrophils: 67 %
Platelets: 259 10*3/uL (ref 150–450)
RBC: 5.3 x10E6/uL (ref 4.14–5.80)
RDW: 13.1 % (ref 11.6–15.4)
WBC: 9.6 10*3/uL (ref 3.4–10.8)

## 2020-11-05 LAB — LIPID PANEL
Chol/HDL Ratio: 3.5 ratio (ref 0.0–5.0)
Cholesterol, Total: 181 mg/dL (ref 100–199)
HDL: 51 mg/dL (ref >39)
LDL Chol Calc (NIH): 107 mg/dL — ABNORMAL HIGH (ref 0–99)
Triglycerides: 131 mg/dL (ref 0–149)
VLDL Cholesterol Cal: 23 mg/dL (ref 5–40)

## 2020-11-05 LAB — TSH: TSH: 3.12 u[IU]/mL (ref 0.450–4.500)

## 2020-11-05 MED ORDER — ROSUVASTATIN CALCIUM 40 MG PO TABS: 80.0000 mg | ORAL_TABLET | Freq: Every day | ORAL | 3 refills | Status: DC

## 2020-11-05 NOTE — Addendum Note (Signed)
Addended by: Eleonore Chiquito on: 11/05/2020 10:29 AM   Modules accepted: Orders

## 2020-11-07 IMAGING — DX DG CHEST 2V
2 series · 2 of 2 positions shown · non-contrast
Comparison: 8372

CLINICAL DATA: Dyspnea

EXAM:
CHEST - 2 VIEW

[chest pa]
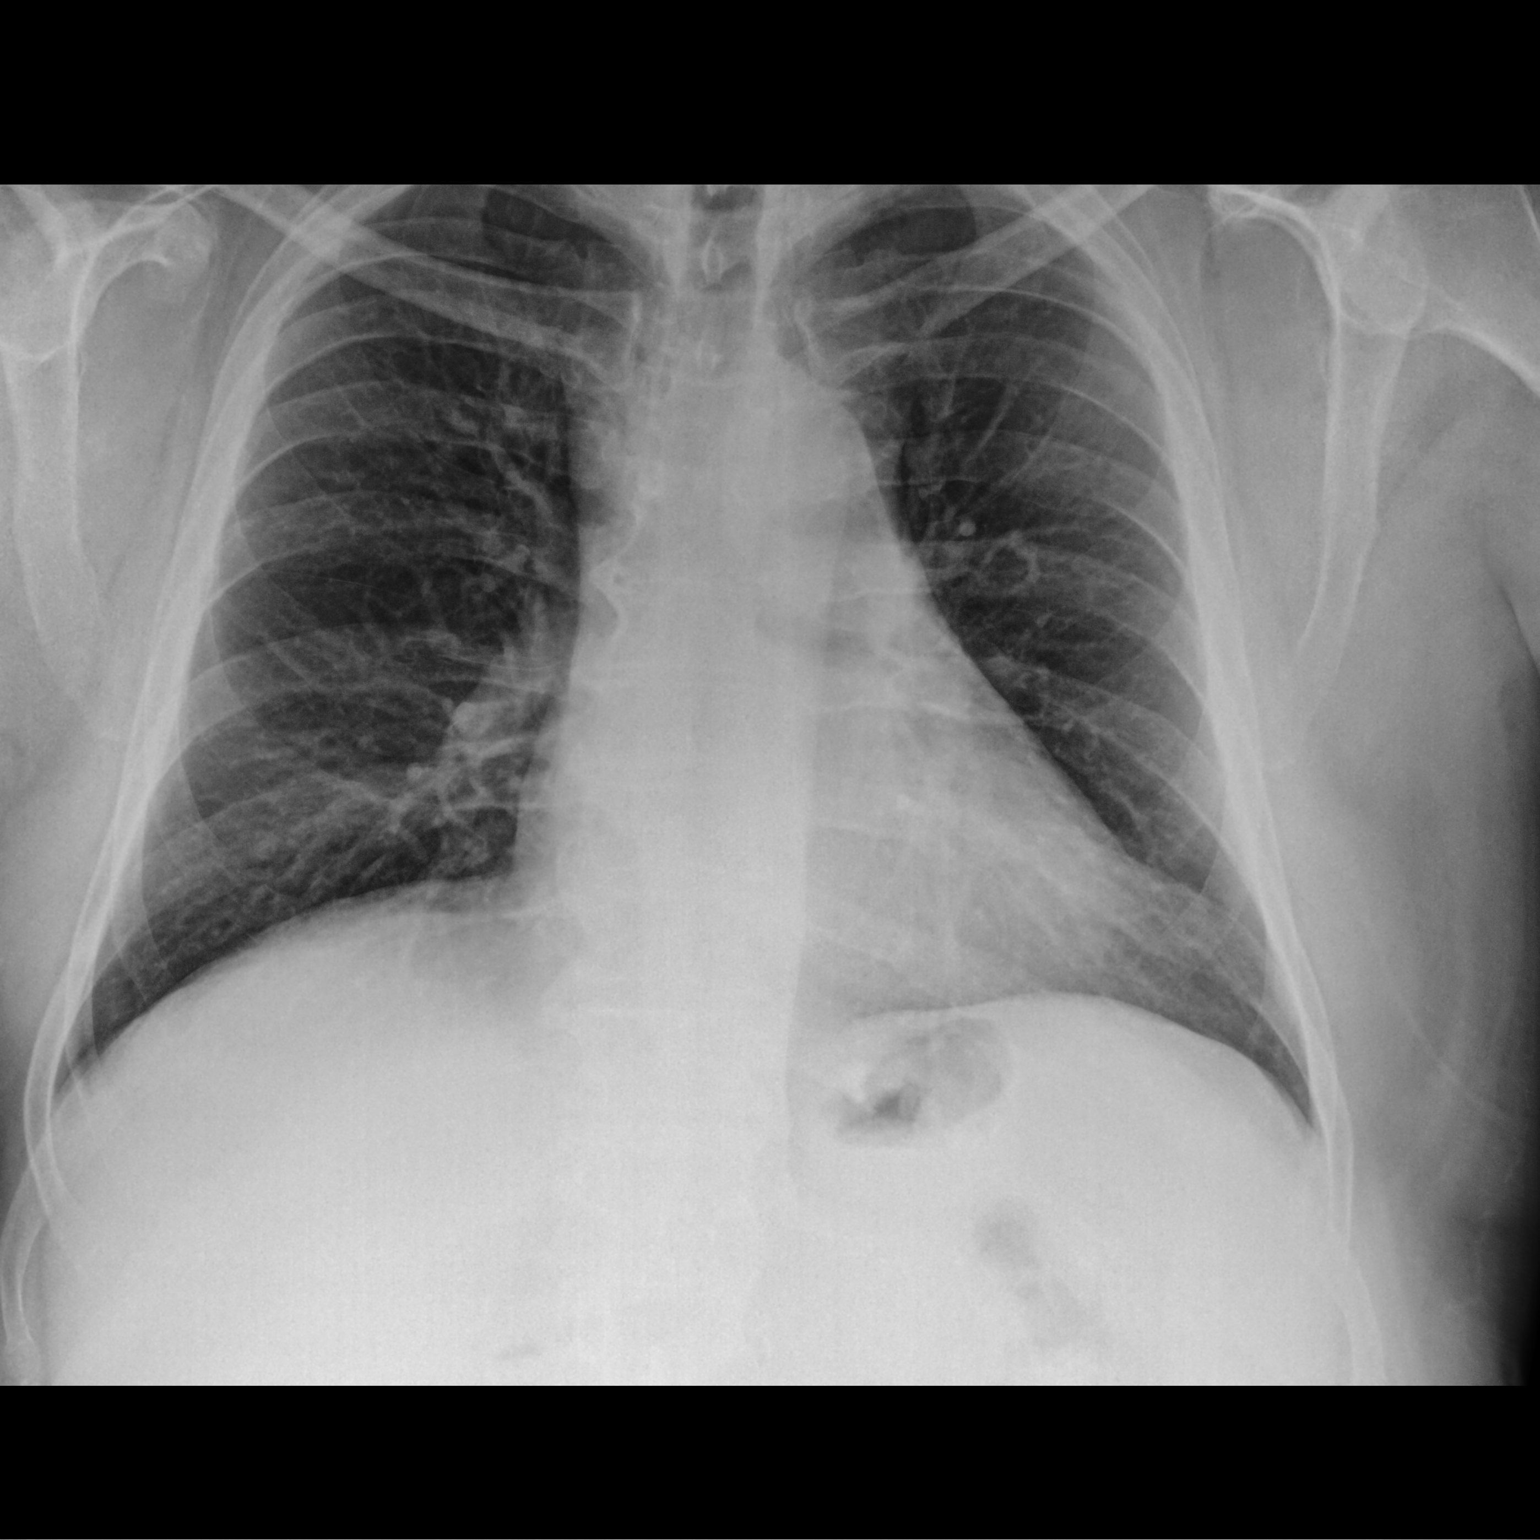

[chest lat]
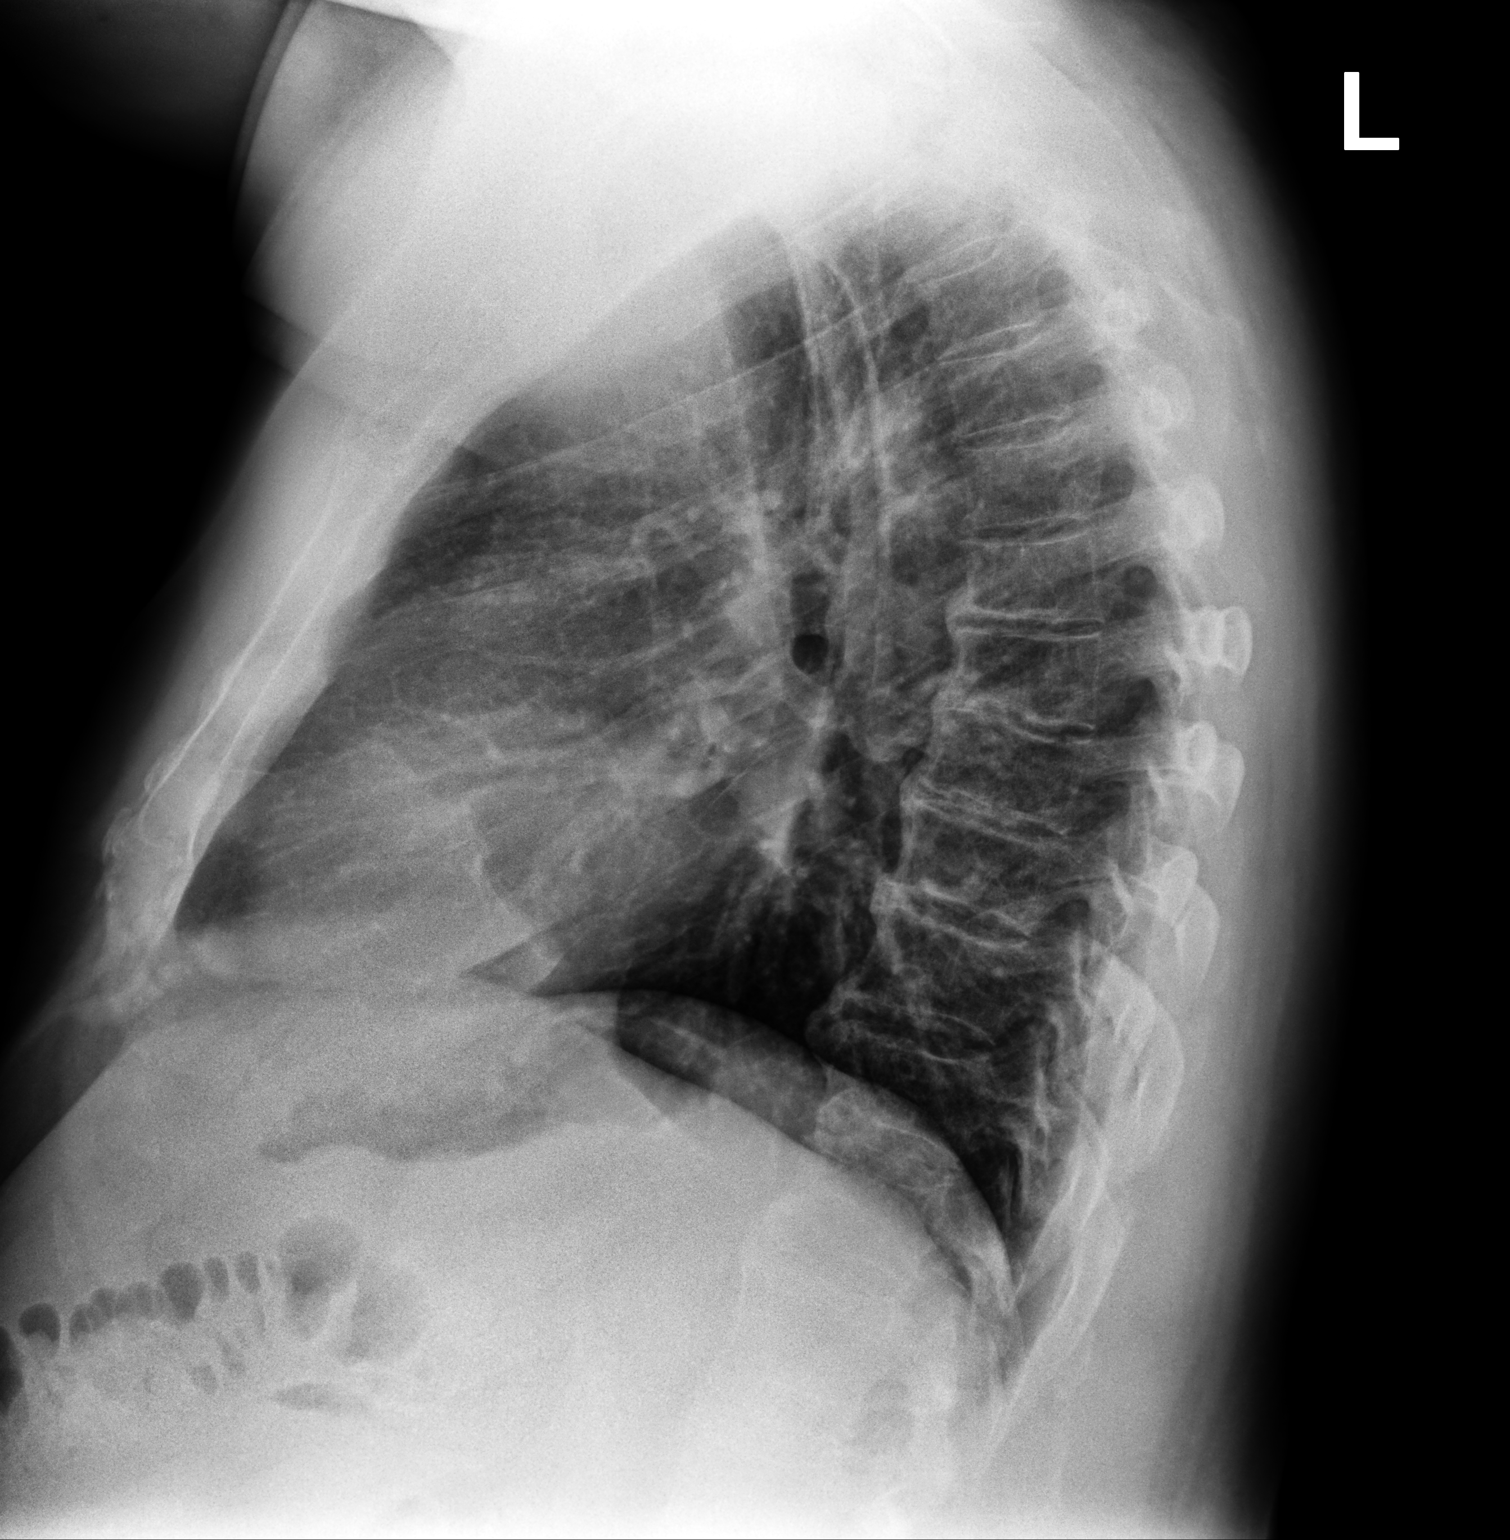

[2 of 2 positions shown; findings below may reference images not displayed]

FINDINGS: The heart size and mediastinal contours are within normal limits.
Both lungs are clear. No pleural effusion or pneumothorax. No acute
osseous abnormality.
IMPRESSION: No acute process in the chest.

## 2020-11-11 ENCOUNTER — Telehealth: Payer: Self-pay

## 2020-11-11 NOTE — Telephone Encounter (Signed)
PA started on CMM for Crestor 40 mg. Key [id number]

## 2020-11-12 NOTE — Telephone Encounter (Signed)
PA approved on CMM for Crestor through 03-19-21.

## 2021-05-23 ENCOUNTER — Ambulatory Visit (INDEPENDENT_AMBULATORY_CARE_PROVIDER_SITE_OTHER): Payer: Medicare Other | Admitting: Cardiology

## 2021-05-23 ENCOUNTER — Other Ambulatory Visit: Payer: Self-pay | Admitting: Pulmonary Disease

## 2021-05-23 ENCOUNTER — Other Ambulatory Visit: Payer: Self-pay

## 2021-05-23 ENCOUNTER — Encounter: Payer: Self-pay | Admitting: Cardiology

## 2021-05-23 VITALS — BP 114/70 | HR 86 | Ht 73.0 in | Wt 248.2 lb

## 2021-05-23 DIAGNOSIS — E782 Mixed hyperlipidemia: Secondary | ICD-10-CM | POA: Diagnosis not present

## 2021-05-23 DIAGNOSIS — E1129 Type 2 diabetes mellitus with other diabetic kidney complication: Secondary | ICD-10-CM

## 2021-05-23 DIAGNOSIS — Z794 Long term (current) use of insulin: Secondary | ICD-10-CM

## 2021-05-23 DIAGNOSIS — Z86711 Personal history of pulmonary embolism: Secondary | ICD-10-CM | POA: Diagnosis not present

## 2021-05-23 DIAGNOSIS — R809 Proteinuria, unspecified: Secondary | ICD-10-CM

## 2021-05-23 DIAGNOSIS — Z1329 Encounter for screening for other suspected endocrine disorder: Secondary | ICD-10-CM

## 2021-05-23 DIAGNOSIS — I2510 Atherosclerotic heart disease of native coronary artery without angina pectoris: Secondary | ICD-10-CM

## 2021-05-23 DIAGNOSIS — Z1321 Encounter for screening for nutritional disorder: Secondary | ICD-10-CM

## 2021-05-23 MED ORDER — ROSUVASTATIN CALCIUM 40 MG PO TABS: 80.0000 mg | ORAL_TABLET | Freq: Every day | ORAL | 3 refills | Status: DC

## 2021-05-23 NOTE — Progress Notes (Signed)
Cardiology Office Note:    Date:  05/23/2021   ID:  Roberto Sosa, DOB 25-Aug-1960, MRN [id number]  PCP:  Algis Greenhouse, MD  Cardiologist:  Jenean Lindau, MD   Referring MD: Algis Greenhouse, MD    ASSESSMENT:    1. Coronary artery disease involving native coronary artery of native heart without angina pectoris   2. Mixed hyperlipidemia   3. History of pulmonary embolism   4. Type 2 diabetes mellitus with microalbuminuria, with long-term current use of insulin (HCC)    PLAN:    In order of problems listed above:  Coronary artery disease: Secondary prevention stressed with the patient.  Importance of compliance with diet and medication stressed any vocalized understanding.  He was advised to walk at least half an hour a day 5 days a week. Essential hypertension: Blood pressure stable and diet was emphasized.  Lifestyle modification urged. Mixed dyslipidemia: On lipid-lowering therapy but the patient has not taken his medications for the past 3 to 4 weeks.  I cautioned him about compliance.  We will do complete blood work today and make decisions about his lipid-lowering medications. Diabetes mellitus: Managed by primary care.  We will do a hemoglobin A1c also and sent to primary care with the report. Patient will be seen in follow-up appointment in 6 months or earlier if the patient has any concerns    Medication Adjustments/Labs and Tests Ordered: Current medicines are reviewed at length with the patient today.  Concerns regarding medicines are outlined above.  No orders of the defined types were placed in this encounter.  No orders of the defined types were placed in this encounter.    No chief complaint on file.    History of Present Illness:    Roberto Sosa is a 61 y.o. male.  Patient has past medical history of coronary artery disease, essential hypertension, mixed dyslipidemia and diabetes mellitus.  He has history of DVT and pulmonary embolism in the past.  He  denies any problems at this time and takes care of activities of daily living.  No chest pain orthopnea or PND.  At the time of my evaluation, the patient is alert awake oriented and in no distress.  Past Medical History:  Diagnosis Date   Atherosclerosis of native coronary artery of native heart with stable angina pectoris ([place]) 06/09/2015   Carbuncle of abdominal wall 12/29/2019   Formatting of this note might be different from the original. [id number]: supraumbilical   Coronary artery disease    Diabetes mellitus without complication ([place])    DVT (deep venous thrombosis) ([place]) 03/07/2016   Dyspnea 08/28/2017   2019: onset after PE   High cholesterol    History of deep venous thrombosis 03/07/2016   Overview:  2017: LLE, extensive, thrombectomy and 2 venous stents, no PE . Post-trauma. Xarelto   History of pulmonary embolism 07/27/2017   2019: occurred off NOAC   Left thigh pain 03/03/2016   Long term (current) use of anticoagulants 11/17/2018   Mixed hyperlipidemia 06/09/2015   Moderate persistent asthma without complication [id number]   Nephrolithiasis 03/14/2016   Type 2 diabetes mellitus with microalbuminuria, with long-term current use of insulin ([place]) 06/09/2015   2018: UACR=200   Vaccination refused by patient 04/23/2020   Formatting of this note might be different from the original. 04/23/2020: all but Tdap, discussed    Past Surgical History:  Procedure Laterality Date   CARDIAC CATHETERIZATION Left 03/09/2016   Procedure: Intravascular Ultrasound/IVUS;  Surgeon: Jannette Fogo  Bridgett Larsson, MD;  Location: [place] CV LAB;  Service: Cardiovascular;  Laterality: Left;  IVC-LT COMMON ILIAC VEIN-LT EXR ILIAC VEIN-LT DEEP FEMORAL/POPLITEAL VEIN LYSIS AND THROMBECTOMY   CARDIAC CATHETERIZATION Left 03/10/2016   Procedure: Intravascular Ultrasound/IVUS;  Surgeon: Elam Dutch, MD;  Location: [place] CV LAB;  Service: Cardiovascular;  Laterality: Left;  Lower extremity venous.   CATARACT  EXTRACTION Bilateral 02/2019   HUMERUS FRACTURE SURGERY Left    ORTHOPEDIC SURGERY Bilateral 06/2006   multiple fractures and breaks   PERIPHERAL VASCULAR CATHETERIZATION Left 03/09/2016   Procedure: Lower Extremity Venography;  Surgeon: Conrad Benedict, MD;  Location: [place] CV LAB;  Service: Cardiovascular;  Laterality: Left;   PERIPHERAL VASCULAR CATHETERIZATION N/A 03/09/2016   Procedure: IVC Venography;  Surgeon: Conrad Gold Canyon, MD;  Location: [place] CV LAB;  Service: Cardiovascular;  Laterality: N/A;   PERIPHERAL VASCULAR CATHETERIZATION Left 03/10/2016   Procedure: Lower Extremity Venography;  Surgeon: Elam Dutch, MD;  Location: [place] CV LAB;  Service: Cardiovascular;  Laterality: Left;   PERIPHERAL VASCULAR CATHETERIZATION Left 03/10/2016   Procedure: Peripheral Vascular Intervention;  Surgeon: Elam Dutch, MD;  Location: [place] CV LAB;  Service: Cardiovascular;  Laterality: Left;  Left common and external iliac vein.     SHOULDER ARTHROSCOPY Right     Current Medications: Current Meds  Medication Sig   albuterol (VENTOLIN HFA) 108 (90 Base) MCG/ACT inhaler Inhale 1-2 puffs into the lungs every 4 (four) hours as needed for wheezing or shortness of breath.   budesonide-formoterol (SYMBICORT) 160-4.5 MCG/ACT inhaler Inhale 2 puffs into the lungs 2 (two) times daily.   carvedilol (COREG) 25 MG tablet Take 25 mg by mouth 2 (two) times daily with a meal.   Dulaglutide (TRULICITY) 3 0000000 SOPN Inject 3 mg into the skin once a week.   ELIQUIS 5 MG TABS tablet Take 5 mg by mouth 2 (two) times daily.    ezetimibe (ZETIA) 10 MG tablet Take 10 mg by mouth every morning.   FARXIGA 10 MG TABS tablet Take 10 mg by mouth every morning.   gabapentin (NEURONTIN) 600 MG tablet Take 600 mg by mouth every morning.    insulin degludec (TRESIBA) 200 UNIT/ML FlexTouch Pen Inject 86 Units into the skin daily.   metFORMIN (GLUCOPHAGE) 1000 MG tablet Take 1,000 mg by mouth 2  (two) times daily with a meal.   montelukast (SINGULAIR) 10 MG tablet Take 1 tablet (10 mg total) by mouth at bedtime.   nitroGLYCERIN (NITROSTAT) 0.4 MG SL tablet DISSOLVE ONE TABLET UNDER THE TONGUE EVERY 5 MINUTES AS NEEDED FOR CHEST PAIN.  DO NOT EXCEED A TOTAL OF 3 DOSES IN 15 MINUTES   rosuvastatin (CRESTOR) 40 MG tablet Take 2 tablets (80 mg total) by mouth daily.     Allergies:   Patient has no known allergies.   Social History   Socioeconomic History   Marital status: Widowed    Spouse name: Not on file   Number of children: Not on file   Years of education: Not on file   Highest education level: Not on file  Occupational History   Occupation: "Floater"    Employer: [employer]    Comment: Radio producer  Tobacco Use   Smoking status: Former    Packs/day: 2.00    Years: 20.00    Pack years: 40.00    Types: Cigarettes, Pipe, Cigars    Quit date: 03/20/1990    Years since quitting: 31.1   Smokeless  tobacco: Current    Types: Chew  Substance and Sexual Activity   Alcohol use: Yes    Comment: 2 beers every 2 months   Drug use: No   Sexual activity: Not Currently  Other Topics Concern   Not on file  Social History Narrative   Not on file   Social Determinants of Health   Financial Resource Strain: Not on file  Food Insecurity: Not on file  Transportation Needs: Not on file  Physical Activity: Not on file  Stress: Not on file  Social Connections: Not on file     Family History: The patient's family history is not on file. He was adopted.  ROS:   Please see the history of present illness.    All other systems reviewed and are negative.  EKGs/Labs/Other Studies Reviewed:    The following studies were reviewed today: I discussed my findings with the patient at length.   Recent Labs: 11/04/2020: ALT 21; BUN 6; Creatinine, Ser 0.93; Hemoglobin 15.6; Platelets 259; Potassium 4.3; Sodium 141; TSH 3.120  Recent Lipid Panel    Component Value Date/Time    CHOL 181 11/04/2020 1152   TRIG 131 11/04/2020 1152   HDL 51 11/04/2020 1152   CHOLHDL 3.5 11/04/2020 1152   LDLCALC 107 (H) 11/04/2020 1152    Physical Exam:    VS:  BP 114/70    Pulse 86    Ht 6\' 1"  (1.854 m)    Wt 248 lb 3.2 oz (112.6 kg)    SpO2 94%    BMI 32.75 kg/m     Wt Readings from Last 3 Encounters:  05/23/21 248 lb 3.2 oz (112.6 kg)  11/04/20 267 lb (121.1 kg)  08/11/20 266 lb 1.6 oz (120.7 kg)     GEN: Patient is in no acute distress HEENT: Normal NECK: No JVD; No carotid bruits LYMPHATICS: No lymphadenopathy CARDIAC: Hear sounds regular, 2/6 systolic murmur at the apex. RESPIRATORY:  Clear to auscultation without rales, wheezing or rhonchi  ABDOMEN: Soft, non-tender, non-distended MUSCULOSKELETAL:  No edema; No deformity  SKIN: Warm and dry NEUROLOGIC:  Alert and oriented x 3 PSYCHIATRIC:  Normal affect   Signed, Jenean Lindau, MD  05/23/2021 11:14 AM    [place]

## 2021-05-23 NOTE — Patient Instructions (Signed)
Medication Instructions:  Your physician recommends that you continue on your current medications as directed. Please refer to the Current Medication list given to you today.  *If you need a refill on your cardiac medications before your next appointment, please call your pharmacy*   Lab Work: Your physician recommends that you have labs done in the office today. Your test included  basic metabolic panel, complete blood count, TSH, liver function and lipids.  If you have labs (blood work) drawn today and your tests are completely normal, you will receive your results only by: MyChart Message (if you have MyChart) OR A paper copy in the mail If you have any lab test that is abnormal or we need to change your treatment, we will call you to review the results.   Testing/Procedures: None ordered   Follow-Up: At [facility], you and your health needs are our priority.  As part of our continuing mission to provide you with exceptional heart care, we have created designated Provider Care Teams.  These Care Teams include your primary Cardiologist (physician) and Advanced Practice Providers (APPs -  Physician Assistants and Nurse Practitioners) who all work together to provide you with the care you need, when you need it.  We recommend signing up for the patient portal called "MyChart".  Sign up information is provided on this After Visit Summary.  MyChart is used to connect with patients for Virtual Visits (Telemedicine).  Patients are able to view lab/test results, encounter notes, upcoming appointments, etc.  Non-urgent messages can be sent to your provider as well.   To learn more about what you can do with MyChart, go to [email or web address].    Your next appointment:   9 month(s)  The format for your next appointment:   In Person  Provider:   Rajan Revankar, MD   Other Instructions NA   

## 2021-05-24 LAB — CBC WITH DIFFERENTIAL/PLATELET
Basophils Absolute: 0.1 10*3/uL (ref 0.0–0.2)
Basos: 0 %
EOS (ABSOLUTE): 0.3 10*3/uL (ref 0.0–0.4)
Eos: 3 %
Hematocrit: 47.3 % (ref 37.5–51.0)
Hemoglobin: 16.2 g/dL (ref 13.0–17.7)
Immature Grans (Abs): 0 10*3/uL (ref 0.0–0.1)
Immature Granulocytes: 0 %
Lymphocytes Absolute: 3 10*3/uL (ref 0.7–3.1)
Lymphs: 25 %
MCH: 29.4 pg (ref 26.6–33.0)
MCHC: 34.2 g/dL (ref 31.5–35.7)
MCV: 86 fL (ref 79–97)
Monocytes Absolute: 0.7 10*3/uL (ref 0.1–0.9)
Monocytes: 6 %
Neutrophils Absolute: 7.7 10*3/uL — ABNORMAL HIGH (ref 1.4–7.0)
Neutrophils: 66 %
Platelets: 242 10*3/uL (ref 150–450)
RBC: 5.51 x10E6/uL (ref 4.14–5.80)
RDW: 12.3 % (ref 11.6–15.4)
WBC: 11.8 10*3/uL — ABNORMAL HIGH (ref 3.4–10.8)

## 2021-05-24 LAB — BASIC METABOLIC PANEL
BUN/Creatinine Ratio: 12 (ref 10–24)
BUN: 12 mg/dL (ref 8–27)
CO2: 22 mmol/L (ref 20–29)
Calcium: 10 mg/dL (ref 8.6–10.2)
Chloride: 102 mmol/L (ref 96–106)
Creatinine, Ser: 1.01 mg/dL (ref 0.76–1.27)
Glucose: 163 mg/dL — ABNORMAL HIGH (ref 70–99)
Potassium: 4.7 mmol/L (ref 3.5–5.2)
Sodium: 141 mmol/L (ref 134–144)
eGFR: 85 mL/min/{1.73_m2} (ref >59)

## 2021-05-24 LAB — LIPID PANEL
Chol/HDL Ratio: 4.3 ratio (ref 0.0–5.0)
Cholesterol, Total: 209 mg/dL — ABNORMAL HIGH (ref 100–199)
HDL: 49 mg/dL (ref >39)
LDL Chol Calc (NIH): 129 mg/dL — ABNORMAL HIGH (ref 0–99)
Triglycerides: 177 mg/dL — ABNORMAL HIGH (ref 0–149)
VLDL Cholesterol Cal: 31 mg/dL (ref 5–40)

## 2021-05-24 LAB — HEPATIC FUNCTION PANEL
ALT: 13 IU/L (ref 0–44)
AST: 16 IU/L (ref 0–40)
Albumin: 4.6 g/dL (ref 3.8–4.9)
Alkaline Phosphatase: 83 IU/L (ref 44–121)
Bilirubin Total: 0.8 mg/dL (ref 0.0–1.2)
Bilirubin, Direct: 0.19 mg/dL (ref 0.00–0.40)
Total Protein: 6.9 g/dL (ref 6.0–8.5)

## 2021-05-24 LAB — VITAMIN D 25 HYDROXY (VIT D DEFICIENCY, FRACTURES): Vit D, 25-Hydroxy: 19.5 ng/mL — ABNORMAL LOW (ref 30.0–100.0)

## 2021-05-24 LAB — TSH: TSH: 4.62 u[IU]/mL — ABNORMAL HIGH (ref 0.450–4.500)

## 2021-05-24 LAB — HEMOGLOBIN A1C
Est. average glucose Bld gHb Est-mCnc: 226 mg/dL
Hgb A1c MFr Bld: 9.5 % — ABNORMAL HIGH (ref 4.8–5.6)

## 2022-01-25 DIAGNOSIS — R600 Localized edema: Secondary | ICD-10-CM

## 2022-01-25 HISTORY — DX: Localized edema: R60.0

## 2022-02-03 DIAGNOSIS — I10 Essential (primary) hypertension: Secondary | ICD-10-CM | POA: Insufficient documentation

## 2022-02-03 HISTORY — DX: Essential (primary) hypertension: I10

## 2022-03-22 ENCOUNTER — Ambulatory Visit: Payer: Medicare Other | Attending: Cardiology | Admitting: Cardiology

## 2022-03-22 ENCOUNTER — Encounter: Payer: Self-pay | Admitting: Cardiology

## 2022-03-22 VITALS — BP 98/58 | HR 82 | Ht 73.0 in | Wt 244.6 lb

## 2022-03-22 DIAGNOSIS — E782 Mixed hyperlipidemia: Secondary | ICD-10-CM

## 2022-03-22 DIAGNOSIS — R809 Proteinuria, unspecified: Secondary | ICD-10-CM

## 2022-03-22 DIAGNOSIS — I10 Essential (primary) hypertension: Secondary | ICD-10-CM | POA: Diagnosis not present

## 2022-03-22 DIAGNOSIS — Z794 Long term (current) use of insulin: Secondary | ICD-10-CM

## 2022-03-22 DIAGNOSIS — Z86711 Personal history of pulmonary embolism: Secondary | ICD-10-CM

## 2022-03-22 DIAGNOSIS — I2510 Atherosclerotic heart disease of native coronary artery without angina pectoris: Secondary | ICD-10-CM | POA: Diagnosis not present

## 2022-03-22 DIAGNOSIS — E1129 Type 2 diabetes mellitus with other diabetic kidney complication: Secondary | ICD-10-CM

## 2022-03-22 MED ORDER — ROSUVASTATIN CALCIUM 40 MG PO TABS: 40.0000 mg | ORAL_TABLET | Freq: Every day | ORAL | 3 refills | Status: DC

## 2022-03-22 NOTE — Patient Instructions (Addendum)
Medication Instructions:  Your physician has recommended you make the following change in your medication:   Decrease your dose of Rosuvastatin to 40 mg (1 tablet) daily.  *If you need a refill on your cardiac medications before your next appointment, please call your pharmacy*   Lab Work: Your physician recommends that you return for lab work in: 1 month You need to have labs done when you are fasting.  You can come Monday through Friday 8:30 am to 12:00 pm and 1:15 to 4:30. You do not need to make an appointment as the order has already been placed. The labs you are going to have done are CMET and Lipids.  If you have labs (blood work) drawn today and your tests are completely normal, you will receive your results only by: [place] (if you have MyChart) OR A paper copy in the mail If you have any lab test that is abnormal or we need to change your treatment, we will call you to review the results.   Testing/Procedures: Your physician has requested that you have an echocardiogram. Echocardiography is a painless test that uses sound waves to create images of your heart. It provides your doctor with information about the size and shape of your heart and how well your heart's chambers and valves are working. This procedure takes approximately one hour. There are no restrictions for this procedure. Please do NOT wear cologne, perfume, aftershave, or lotions (deodorant is allowed). Please arrive 15 minutes prior to your appointment time.     Follow-Up: At [facility] you and your health needs are our priority.  As part of our continuing mission to provide you with exceptional heart care, we have created designated Provider Care Teams.  These Care Teams include your primary Cardiologist (physician) and Advanced Practice Providers (APPs -  Physician Assistants and Nurse Practitioners) who all work together to provide you with the care you need, when you need it.  We recommend  signing up for the patient portal called "MyChart".  Sign up information is provided on this After Visit Summary.  MyChart is used to connect with patients for Virtual Visits (Telemedicine).  Patients are able to view lab/test results, encounter notes, upcoming appointments, etc.  Non-urgent messages can be sent to your provider as well.   To learn more about what you can do with MyChart, go to [email or web address].    Your next appointment:   9 months  The format for your next appointment:   In Person  Provider:   Jyl Heinz, MD   Other Instructions Echocardiogram An echocardiogram is a test that uses sound waves (ultrasound) to produce images of the heart. Images from an echocardiogram can provide important information about: Heart size and shape. The size and thickness and movement of your heart's walls. Heart muscle function and strength. Heart valve function or if you have stenosis. Stenosis is when the heart valves are too narrow. If blood is flowing backward through the heart valves (regurgitation). A tumor or infectious growth around the heart valves. Areas of heart muscle that are not working well because of poor blood flow or injury from a heart attack. Aneurysm detection. An aneurysm is a weak or damaged part of an artery wall. The wall bulges out from the normal force of blood pumping through the body. Tell a health care provider about: Any allergies you have. All medicines you are taking, including vitamins, herbs, eye drops, creams, and over-the-counter medicines. Any blood disorders you have. Any surgeries you  have had. Any medical conditions you have. Whether you are pregnant or may be pregnant. What are the risks? Generally, this is a safe test. However, problems may occur, including an allergic reaction to dye (contrast) that may be used during the test. What happens before the test? No specific preparation is needed. You may eat and drink normally. What  happens during the test? You will take off your clothes from the waist up and put on a hospital gown. Electrodes or electrocardiogram (ECG)patches may be placed on your chest. The electrodes or patches are then connected to a device that monitors your heart rate and rhythm. You will lie down on a table for an ultrasound exam. A gel will be applied to your chest to help sound waves pass through your skin. A handheld device, called a transducer, will be pressed against your chest and moved over your heart. The transducer produces sound waves that travel to your heart and bounce back (or "echo" back) to the transducer. These sound waves will be captured in real-time and changed into images of your heart that can be viewed on a video monitor. The images will be recorded on a computer and reviewed by your health care provider. You may be asked to change positions or hold your breath for a short time. This makes it easier to get different views or better views of your heart. In some cases, you may receive contrast through an IV in one of your veins. This can improve the quality of the pictures from your heart. The procedure may vary among health care providers and hospitals.   What can I expect after the test? You may return to your normal, everyday life, including diet, activities, and medicines, unless your health care provider tells you not to do that. Follow these instructions at home: It is up to you to get the results of your test. Ask your health care provider, or the department that is doing the test, when your results will be ready. Keep all follow-up visits. This is important. Summary An echocardiogram is a test that uses sound waves (ultrasound) to produce images of the heart. Images from an echocardiogram can provide important information about the size and shape of your heart, heart muscle function, heart valve function, and other possible heart problems. You do not need to do anything to  prepare before this test. You may eat and drink normally. After the echocardiogram is completed, you may return to your normal, everyday life, unless your health care provider tells you not to do that. This information is not intended to replace advice given to you by your health care provider. Make sure you discuss any questions you have with your health care provider. Document Revised: 10/28/2019 Document Reviewed: 10/28/2019 Elsevier Patient Education  [place]

## 2022-03-22 NOTE — Progress Notes (Signed)
Cardiology Office Note:    Date:  03/22/2022   ID:  Roberto Sosa, DOB 1961-02-17, MRN [id number]  PCP:  Roberto Bass, MD  Cardiologist:  Roberto Brothers, MD   Referring MD: Roberto Bass, MD    ASSESSMENT:    1. Coronary artery disease involving native coronary artery of native heart without angina pectoris   2. Primary hypertension   3. Type 2 diabetes mellitus with microalbuminuria, with long-term current use of insulin (HCC)   4. History of pulmonary embolism   5. Mixed hyperlipidemia    PLAN:    In order of problems listed above:  Coronary artery disease: Mild and nonobstructive in nature.  Secondary prevention stressed with the patient.  Importance of compliance with diet medication stressed and she vocalized understanding. History of pulmonary embolism on anticoagulation: Benefits and potential risks of anticoagulation explained to the patient and he vocalized understanding and questions were answered to satisfaction. Diabetes mellitus: Followed by primary care.  Diet was emphasized. Mixed dyslipidemia: Diet was emphasized.  He apparently is taking 2 tablets of rosuvastatin 40 mg daily by endocrine.  I corrected him about this.  He will take only 1 tablet from now on and come back in a month for Chem-7 liver lipid check. History of mildly reduced ejection fraction in 2020.  We will repeat echocardiogram to assess this and intervene if necessary Patient will be seen in follow-up appointment in 6 months or earlier if the patient has any concerns    Medication Adjustments/Labs and Tests Ordered: Current medicines are reviewed at length with the patient today.  Concerns regarding medicines are outlined above.  Orders Placed This Encounter  Procedures   Lipid panel   Comprehensive metabolic panel   EKG 12-Lead   ECHOCARDIOGRAM COMPLETE   Meds ordered this encounter  Medications   rosuvastatin (CRESTOR) 40 MG tablet    Sig: Take 1 tablet (40 mg total) by mouth  daily.    Dispense:  90 tablet    Refill:  3     No chief complaint on file.    History of Present Illness:    Roberto Sosa is a 62 y.o. male.  Patient has past medical history of coronary artery disease, essential hypertension, mixed dyslipidemia and diabetes mellitus.  He denies any problems at this time and takes care of activities of daily living.  No chest pain orthopnea or PND.  He is on anticoagulation for history of pulmonary embolism.  At the time of my evaluation, the patient is alert awake oriented and in no distress.  Past Medical History:  Diagnosis Date   Atherosclerosis of native coronary artery of native heart with stable angina pectoris (HCC) 06/09/2015   Bilateral lower extremity edema 01/25/2022   Formatting of this note might be different from the original. 01/25/2022 :   Carbuncle of abdominal wall 12/29/2019   Formatting of this note might be different from the original. 12/29/2019: supraumbilical   Coronary artery disease    Diabetes mellitus without complication (HCC)    DVT (deep venous thrombosis) (HCC) 03/07/2016   Dyspnea 08/28/2017   2019: onset after PE   High cholesterol    History of deep venous thrombosis 03/07/2016   Overview:  2017: LLE, extensive, thrombectomy and 2 venous stents, no PE . Post-trauma. Xarelto   History of pulmonary embolism 07/27/2017   2019: occurred off NOAC   Hypertension 02/03/2022   Left thigh pain 03/03/2016   Long term (current) use of anticoagulants 11/17/2018  Mixed hyperlipidemia 06/09/2015   Moderate persistent asthma without complication 35/57/3220   Nephrolithiasis 03/14/2016   Type 2 diabetes mellitus with microalbuminuria, with long-term current use of insulin ([place]) 06/09/2015   2018: UACR=200   Vaccination refused by patient 04/23/2020   Formatting of this note might be different from the original. 04/23/2020: all but Tdap, discussed    Past Surgical History:  Procedure Laterality Date   CARDIAC  CATHETERIZATION Left 03/09/2016   Procedure: Intravascular Ultrasound/IVUS;  Surgeon: Conrad Pippa Passes, MD;  Location: [place] [facility];  Service: Cardiovascular;  Laterality: Left;  IVC-LT COMMON ILIAC VEIN-LT EXR ILIAC VEIN-LT DEEP FEMORAL/POPLITEAL VEIN LYSIS AND THROMBECTOMY   CARDIAC CATHETERIZATION Left 03/10/2016   Procedure: Intravascular Ultrasound/IVUS;  Surgeon: Elam Dutch, MD;  Location: [place] [facility];  Service: Cardiovascular;  Laterality: Left;  Lower extremity venous.   CATARACT EXTRACTION Bilateral 02/2019   HUMERUS FRACTURE SURGERY Left    ORTHOPEDIC SURGERY Bilateral 06/2006   multiple fractures and breaks   PERIPHERAL VASCULAR CATHETERIZATION Left 03/09/2016   Procedure: Lower Extremity Venography;  Surgeon: Conrad Sapulpa, MD;  Location: [place] [facility];  Service: Cardiovascular;  Laterality: Left;   PERIPHERAL VASCULAR CATHETERIZATION N/A 03/09/2016   Procedure: IVC Venography;  Surgeon: Conrad Buda, MD;  Location: [place] [facility];  Service: Cardiovascular;  Laterality: N/A;   PERIPHERAL VASCULAR CATHETERIZATION Left 03/10/2016   Procedure: Lower Extremity Venography;  Surgeon: Elam Dutch, MD;  Location: [place] [facility];  Service: Cardiovascular;  Laterality: Left;   PERIPHERAL VASCULAR CATHETERIZATION Left 03/10/2016   Procedure: Peripheral Vascular Intervention;  Surgeon: Elam Dutch, MD;  Location: [place] [facility];  Service: Cardiovascular;  Laterality: Left;  Left common and external iliac vein.     SHOULDER ARTHROSCOPY Right     Current Medications: Current Meds  Medication Sig   carvedilol (COREG) 25 MG tablet Take 25 mg by mouth 2 (two) times daily with a meal.   ELIQUIS 5 MG TABS tablet Take 5 mg by mouth 2 (two) times daily.    ezetimibe (ZETIA) 10 MG tablet Take 10 mg by mouth every morning.   FARXIGA 10 MG TABS tablet Take 10 mg by mouth every morning.   gabapentin (NEURONTIN) 600 MG tablet Take 600 mg by mouth as needed  (pain).   insulin degludec (TRESIBA) 200 UNIT/ML FlexTouch Pen Inject 86 Units into the skin daily.   metFORMIN (GLUCOPHAGE) 1000 MG tablet Take 1,000 mg by mouth 2 (two) times daily with a meal.   nitroGLYCERIN (NITROSTAT) 0.4 MG SL tablet DISSOLVE ONE TABLET UNDER THE TONGUE EVERY 5 MINUTES AS NEEDED FOR CHEST PAIN.  DO NOT EXCEED A TOTAL OF 3 DOSES IN 15 MINUTES   TRULICITY 4.5 UR/4.2HC SOPN Inject 4.5 mg into the skin once a week.   [DISCONTINUED] rosuvastatin (CRESTOR) 40 MG tablet Take 2 tablets (80 mg total) by mouth daily.     Allergies:   Patient has no known allergies.   Social History   Socioeconomic History   Marital status: Widowed    Spouse name: Not on file   Number of children: Not on file   Years of education: Not on file   Highest education level: Not on file  Occupational History   Occupation: "Floater"    Employer: [employer]    Comment: Radio producer  Tobacco Use   Smoking status: Former    Packs/day: 2.00    Years: 20.00    Total pack years: 40.00  Types: Cigarettes, Pipe, Cigars    Quit date: 03/20/1990    Years since quitting: 32.0   Smokeless tobacco: Current    Types: Chew  Substance and Sexual Activity   Alcohol use: Yes    Comment: 2 beers every 2 months   Drug use: No   Sexual activity: Not Currently  Other Topics Concern   Not on file  Social History Narrative   Not on file   Social Determinants of Health   Financial Resource Strain: Not on file  Food Insecurity: Not on file  Transportation Needs: Not on file  Physical Activity: Not on file  Stress: Not on file  Social Connections: Not on file     Family History: The patient's family history is not on file. He was adopted.  ROS:   Please see the history of present illness.    All other systems reviewed and are negative.  EKGs/Labs/Other Studies Reviewed:    The following studies were reviewed today: EKG reveals sinus rhythm right axis deviation and nonspecific ST-T  changes.   Recent Labs: 05/23/2021: ALT 13; BUN 12; Creatinine, Ser 1.01; Hemoglobin 16.2; Platelets 242; Potassium 4.7; Sodium 141; TSH 4.620  Recent Lipid Panel    Component Value Date/Time   CHOL 209 (H) 05/23/2021 1128   TRIG 177 (H) 05/23/2021 1128   HDL 49 05/23/2021 1128   CHOLHDL 4.3 05/23/2021 1128   LDLCALC 129 (H) 05/23/2021 1128    Physical Exam:    VS:  BP (!) 98/58   Pulse 82   Ht 6\' 1"  (1.854 m)   Wt 244 lb 9.6 oz (110.9 kg)   SpO2 99%   BMI 32.27 kg/m     Wt Readings from Last 3 Encounters:  03/22/22 244 lb 9.6 oz (110.9 kg)  05/23/21 248 lb 3.2 oz (112.6 kg)  11/04/20 267 lb (121.1 kg)     GEN: Patient is in no acute distress HEENT: Normal NECK: No JVD; No carotid bruits LYMPHATICS: No lymphadenopathy CARDIAC: Hear sounds regular, 2/6 systolic murmur at the apex. RESPIRATORY:  Clear to auscultation without rales, wheezing or rhonchi  ABDOMEN: Soft, non-tender, non-distended MUSCULOSKELETAL:  No edema; No deformity  SKIN: Warm and dry NEUROLOGIC:  Alert and oriented x 3 PSYCHIATRIC:  Normal affect   Signed, Jenean Lindau, MD  03/22/2022 10:47 AM    [place]

## 2022-03-28 ENCOUNTER — Ambulatory Visit: Payer: Medicare Other | Attending: Cardiology

## 2022-03-28 DIAGNOSIS — S81811A Laceration without foreign body, right lower leg, initial encounter: Secondary | ICD-10-CM | POA: Insufficient documentation

## 2022-03-28 DIAGNOSIS — I2510 Atherosclerotic heart disease of native coronary artery without angina pectoris: Secondary | ICD-10-CM | POA: Diagnosis not present

## 2022-03-28 HISTORY — DX: Laceration without foreign body, right lower leg, initial encounter: S81.811A

## 2022-03-28 LAB — ECHOCARDIOGRAM COMPLETE
Area-P 1/2: 3.51 cm2
S' Lateral: 2.8 cm

## 2022-03-29 ENCOUNTER — Telehealth: Payer: Self-pay

## 2022-03-29 DIAGNOSIS — E782 Mixed hyperlipidemia: Secondary | ICD-10-CM

## 2022-03-29 DIAGNOSIS — I2510 Atherosclerotic heart disease of native coronary artery without angina pectoris: Secondary | ICD-10-CM

## 2022-03-29 LAB — LIPID PANEL
Chol/HDL Ratio: 2.2 ratio (ref 0.0–5.0)
Cholesterol, Total: 89 mg/dL — ABNORMAL LOW (ref 100–199)
HDL: 41 mg/dL (ref >39)
LDL Chol Calc (NIH): 30 mg/dL (ref 0–99)
Triglycerides: 93 mg/dL (ref 0–149)
VLDL Cholesterol Cal: 18 mg/dL (ref 5–40)

## 2022-03-29 LAB — COMPREHENSIVE METABOLIC PANEL
ALT: 23 IU/L (ref 0–44)
AST: 20 IU/L (ref 0–40)
Albumin/Globulin Ratio: 2.4 — ABNORMAL HIGH (ref 1.2–2.2)
Albumin: 4.5 g/dL (ref 3.9–4.9)
Alkaline Phosphatase: 85 IU/L (ref 44–121)
BUN/Creatinine Ratio: 17 (ref 10–24)
BUN: 18 mg/dL (ref 8–27)
Bilirubin Total: 0.9 mg/dL (ref 0.0–1.2)
CO2: 21 mmol/L (ref 20–29)
Calcium: 9.4 mg/dL (ref 8.6–10.2)
Chloride: 103 mmol/L (ref 96–106)
Creatinine, Ser: 1.08 mg/dL (ref 0.76–1.27)
Globulin, Total: 1.9 g/dL (ref 1.5–4.5)
Glucose: 170 mg/dL — ABNORMAL HIGH (ref 70–99)
Potassium: 4.6 mmol/L (ref 3.5–5.2)
Sodium: 140 mmol/L (ref 134–144)
Total Protein: 6.4 g/dL (ref 6.0–8.5)
eGFR: 78 mL/min/{1.73_m2} (ref >59)

## 2022-03-29 MED ORDER — ROSUVASTATIN CALCIUM 20 MG PO TABS: 20.0000 mg | ORAL_TABLET | Freq: Every day | ORAL | 3 refills | Status: DC

## 2022-03-29 NOTE — Telephone Encounter (Signed)
-----   Message from Jenean Lindau, MD sent at 03/29/2022  9:48 AM EST ----- Labs are fine.  Reduce statin to half dose and Chem-7 liver lipid check in 4 months.  Copy primary care Jenean Lindau, MD 03/29/2022 9:48 AM

## 2022-05-03 ENCOUNTER — Other Ambulatory Visit: Payer: Self-pay | Admitting: Cardiology

## 2022-05-03 DIAGNOSIS — Z794 Long term (current) use of insulin: Secondary | ICD-10-CM

## 2022-05-03 DIAGNOSIS — I2510 Atherosclerotic heart disease of native coronary artery without angina pectoris: Secondary | ICD-10-CM

## 2022-05-03 DIAGNOSIS — E782 Mixed hyperlipidemia: Secondary | ICD-10-CM

## 2022-12-20 ENCOUNTER — Other Ambulatory Visit: Payer: Self-pay | Admitting: Cardiology

## 2022-12-20 MED ORDER — NITROGLYCERIN 0.4 MG SL SUBL: 0.4000 mg | SUBLINGUAL_TABLET | SUBLINGUAL | 2 refills | Status: DC | PRN

## 2023-02-06 DIAGNOSIS — S2231XD Fracture of one rib, right side, subsequent encounter for fracture with routine healing: Secondary | ICD-10-CM | POA: Insufficient documentation

## 2023-02-06 HISTORY — DX: Fracture of one rib, right side, subsequent encounter for fracture with routine healing: S22.31XD

## 2023-02-23 ENCOUNTER — Encounter: Payer: Self-pay | Admitting: Cardiology

## 2023-02-23 ENCOUNTER — Ambulatory Visit: Payer: 59 | Attending: Cardiology | Admitting: Cardiology

## 2023-02-23 VITALS — BP 132/68 | HR 84 | Ht 73.0 in | Wt 271.8 lb

## 2023-02-23 DIAGNOSIS — I10 Essential (primary) hypertension: Secondary | ICD-10-CM

## 2023-02-23 DIAGNOSIS — Z86711 Personal history of pulmonary embolism: Secondary | ICD-10-CM

## 2023-02-23 DIAGNOSIS — E1140 Type 2 diabetes mellitus with diabetic neuropathy, unspecified: Secondary | ICD-10-CM

## 2023-02-23 DIAGNOSIS — Z794 Long term (current) use of insulin: Secondary | ICD-10-CM

## 2023-02-23 DIAGNOSIS — I25118 Atherosclerotic heart disease of native coronary artery with other forms of angina pectoris: Secondary | ICD-10-CM | POA: Diagnosis not present

## 2023-02-23 DIAGNOSIS — E782 Mixed hyperlipidemia: Secondary | ICD-10-CM

## 2023-02-23 NOTE — Progress Notes (Signed)
Cardiology Office Note:    Date:  02/23/2023   ID:  Roberto Sosa, DOB 1960-08-13, MRN [id number]  PCP:  Roberto Bass, MD  Cardiologist:  Roberto Brothers, MD   Referring MD: Roberto Bass, MD    ASSESSMENT:    1. Atherosclerosis of native coronary artery of native heart with stable angina pectoris (HCC)   2. Primary hypertension   3. Type 2 diabetes mellitus with diabetic neuropathy, with long-term current use of insulin (HCC)   4. History of pulmonary embolism   5. Mixed hyperlipidemia    PLAN:    In order of problems listed above:  Coronary artery disease: Mild and nonobstructive in nature: Secondary prevention stressed to the patient.  Importance of compliance with diet medication stressed and vocalized understanding. History of pulmonary embolism: On anticoagulation followed by primary care. Essential hypertension: Blood pressure is stable and diet was emphasized.  Lifestyle modification urged. Mixed dyslipidemia: On lipid-lowering medications followed by primary care.  Lipids reviewed from [facility] sheet. Diabetes mellitus and obesity: Markedly elevated hemoglobin A1c but this was earlier this year.  He mentions to me that this is better.  Diet was emphasized.  Lifestyle modifications stressed risks of uncontrolled diabetes and obesity discussed and he promises to do better. Dyspnea on exertion: Managed by pulmonary.  He is monitored by them closely. Patient will be seen in follow-up appointment in 12 months or earlier if the patient has any concerns.    Medication Adjustments/Labs and Tests Ordered: Current medicines are reviewed at length with the patient today.  Concerns regarding medicines are outlined above.  No orders of the defined types were placed in this encounter.  No orders of the defined types were placed in this encounter.    No chief complaint on file.    History of Present Illness:    Roberto Sosa is a 62 y.o. male.  Patient has past medical  history of coronary artery disease nonobstructive in nature by CT coronary angiography in 2022, essential hypertension, mixed dyslipidemia, diabetes mellitus morbid obesity and history of pulmonary embolism.  He denies any problems at this time but he mentions to me that his dyspnea on exertion is consistent.  Past Medical History:  Diagnosis Date   Atherosclerosis of native coronary artery of native heart with stable angina pectoris (HCC) 06/09/2015   Bilateral lower extremity edema 01/25/2022   Formatting of this note might be different from the original. 01/25/2022 :   Carbuncle of abdominal wall 12/29/2019   Formatting of this note might be different from the original. 12/29/2019: supraumbilical   Closed fracture of one rib of right side with routine healing 02/06/2023   01/2023 : left 7th, slip and fall     Coronary artery disease    Diabetes mellitus without complication (HCC)    DVT (deep venous thrombosis) (HCC) 03/07/2016   Dyspnea 08/28/2017   2019: onset after PE   High cholesterol    History of deep venous thrombosis 03/07/2016   Overview:  2017: LLE, extensive, thrombectomy and 2 venous stents, no PE . Post-trauma. Xarelto   History of pulmonary embolism 07/27/2017   2019: occurred off NOAC   Hypertension 02/03/2022   Laceration of right lower leg 03/28/2022   02/2022     Left thigh pain 03/03/2016   Long term (current) use of anticoagulants 11/17/2018   Mixed hyperlipidemia 06/09/2015   Moderate persistent asthma without complication 03/20/2019   Nephrolithiasis 03/14/2016   Type 2 diabetes mellitus with diabetic neuropathy, with  long-term current use of insulin (HCC) 06/09/2015   Type 2 diabetes mellitus with microalbuminuria, with long-term current use of insulin (HCC) 06/09/2015   2018: UACR=200   Vaccination refused by patient 04/23/2020   Formatting of this note might be different from the original. 04/23/2020: all but Tdap, discussed    Past Surgical History:   Procedure Laterality Date   CARDIAC CATHETERIZATION Left 03/09/2016   Procedure: Intravascular Ultrasound/IVUS;  Surgeon: Fransisco Hertz, MD;  Location: [facility] INVASIVE CV LAB;  Service: Cardiovascular;  Laterality: Left;  IVC-LT COMMON ILIAC VEIN-LT EXR ILIAC VEIN-LT DEEP FEMORAL/POPLITEAL VEIN LYSIS AND THROMBECTOMY   CARDIAC CATHETERIZATION Left 03/10/2016   Procedure: Intravascular Ultrasound/IVUS;  Surgeon: Sherren Kerns, MD;  Location: [facility] INVASIVE CV LAB;  Service: Cardiovascular;  Laterality: Left;  Lower extremity venous.   CATARACT EXTRACTION Bilateral 02/2019   HUMERUS FRACTURE SURGERY Left    ORTHOPEDIC SURGERY Bilateral 06/2006   multiple fractures and breaks   PERIPHERAL VASCULAR CATHETERIZATION Left 03/09/2016   Procedure: Lower Extremity Venography;  Surgeon: Fransisco Hertz, MD;  Location: [facility] INVASIVE CV LAB;  Service: Cardiovascular;  Laterality: Left;   PERIPHERAL VASCULAR CATHETERIZATION N/A 03/09/2016   Procedure: IVC Venography;  Surgeon: Fransisco Hertz, MD;  Location: [facility] INVASIVE CV LAB;  Service: Cardiovascular;  Laterality: N/A;   PERIPHERAL VASCULAR CATHETERIZATION Left 03/10/2016   Procedure: Lower Extremity Venography;  Surgeon: Sherren Kerns, MD;  Location: [facility] INVASIVE CV LAB;  Service: Cardiovascular;  Laterality: Left;   PERIPHERAL VASCULAR CATHETERIZATION Left 03/10/2016   Procedure: Peripheral Vascular Intervention;  Surgeon: Sherren Kerns, MD;  Location: [facility] INVASIVE CV LAB;  Service: Cardiovascular;  Laterality: Left;  Left common and external iliac vein.     SHOULDER ARTHROSCOPY Right     Current Medications: Current Meds  Medication Sig   carvedilol (COREG) 25 MG tablet Take 25 mg by mouth 2 (two) times daily with a meal.   ELIQUIS 5 MG TABS tablet Take 5 mg by mouth 2 (two) times daily.    ezetimibe (ZETIA) 10 MG tablet Take 10 mg by mouth every morning.   FARXIGA 10 MG TABS tablet Take 10 mg by mouth every morning.   gabapentin (NEURONTIN) 600 MG  tablet Take 600 mg by mouth as needed (pain).   insulin degludec (TRESIBA) 200 UNIT/ML FlexTouch Pen Inject 86 Units into the skin daily.   metFORMIN (GLUCOPHAGE) 1000 MG tablet Take 1,000 mg by mouth 2 (two) times daily with a meal.   nitroGLYCERIN (NITROSTAT) 0.4 MG SL tablet Place 1 tablet (0.4 mg total) under the tongue every 5 (five) minutes as needed for chest pain.   rosuvastatin (CRESTOR) 20 MG tablet Take 1 tablet (20 mg total) by mouth daily.   TRULICITY 4.5 MG/0.5ML SOPN Inject 4.5 mg into the skin once a week.     Allergies:   Patient has no known allergies.   Social History   Socioeconomic History   Marital status: Widowed    Spouse name: Not on file   Number of children: Not on file   Years of education: Not on file   Highest education level: Not on file  Occupational History   Occupation: "Music therapist"    Employer: [employer]    Comment: Media planner  Tobacco Use   Smoking status: Former    Current packs/day: 0.00    Average packs/day: 2.0 packs/day for 20.0 years (40.0 ttl pk-yrs)    Types: Cigarettes, Pipe, Cigars    Start date: 03/20/1970  Quit date: 03/20/1990    Years since quitting: 32.9   Smokeless tobacco: Current    Types: Chew  Substance and Sexual Activity   Alcohol use: Yes    Comment: 2 beers every 2 months   Drug use: No   Sexual activity: Not Currently  Other Topics Concern   Not on file  Social History Narrative   Not on file   Social Determinants of Health   Financial Resource Strain: Not on file  Food Insecurity: Low Risk  (09/14/2022)   Received from [facility]   Hunger Vital Sign    Worried About Running Out of Food in the Last Year: Never true    Ran Out of Food in the Last Year: Never true  Transportation Needs: No Transportation Needs (09/14/2022)   Received from [employer]    In the past 12 months, has lack of reliable transportation kept you from medical appointments, meetings, work or from getting things  needed for daily living? : No  Physical Activity: Not on file  Stress: Not on file  Social Connections: Not on file     Family History: The patient's family history is not on file. He was adopted.  ROS:   Please see the history of present illness.    All other systems reviewed and are negative.  EKGs/Labs/Other Studies Reviewed:    The following studies were reviewed today: I discussed my findings with the patient at length.   Recent Labs: 03/28/2022: ALT 23; BUN 18; Creatinine, Ser 1.08; Potassium 4.6; Sodium 140  Recent Lipid Panel    Component Value Date/Time   CHOL 89 (L) 03/28/2022 1309   TRIG 93 03/28/2022 1309   HDL 41 03/28/2022 1309   CHOLHDL 2.2 03/28/2022 1309   LDLCALC 30 03/28/2022 1309    Physical Exam:    VS:  BP 132/68   Pulse 84   Ht 6\' 1"  (1.854 m)   Wt 271 lb 12.8 oz (123.3 kg)   SpO2 95%   BMI 35.86 kg/m     Wt Readings from Last 3 Encounters:  02/23/23 271 lb 12.8 oz (123.3 kg)  03/22/22 244 lb 9.6 oz (110.9 kg)  05/23/21 248 lb 3.2 oz (112.6 kg)     GEN: Patient is in no acute distress HEENT: Normal NECK: No JVD; No carotid bruits LYMPHATICS: No lymphadenopathy CARDIAC: Hear sounds regular, 2/6 systolic murmur at the apex. RESPIRATORY:  Clear to auscultation without rales, wheezing or rhonchi  ABDOMEN: Soft, non-tender, non-distended MUSCULOSKELETAL:  No edema; No deformity  SKIN: Warm and dry NEUROLOGIC:  Alert and oriented x 3 PSYCHIATRIC:  Normal affect   Signed, Roberto Brothers, MD  02/23/2023 3:41 PM    [facility]

## 2023-02-23 NOTE — Patient Instructions (Signed)

## 2023-02-25 ENCOUNTER — Other Ambulatory Visit: Payer: Self-pay | Admitting: Cardiology

## 2023-03-16 ENCOUNTER — Other Ambulatory Visit: Payer: Self-pay | Admitting: Cardiology

## 2023-03-16 DIAGNOSIS — I2510 Atherosclerotic heart disease of native coronary artery without angina pectoris: Secondary | ICD-10-CM

## 2023-03-16 DIAGNOSIS — E782 Mixed hyperlipidemia: Secondary | ICD-10-CM

## 2023-12-29 ENCOUNTER — Encounter: Payer: Self-pay | Admitting: Cardiology

## 2023-12-31 MED ORDER — CARVEDILOL 25 MG PO TABS: 25.0000 mg | ORAL_TABLET | Freq: Two times a day (BID) | ORAL | 1 refills | Status: AC

## 2024-02-12 ENCOUNTER — Encounter: Payer: Self-pay | Admitting: Cardiology

## 2024-02-12 ENCOUNTER — Ambulatory Visit: Attending: Cardiology | Admitting: Cardiology

## 2024-02-12 VITALS — BP 132/72 | HR 95 | Ht 73.0 in | Wt 259.0 lb

## 2024-02-12 DIAGNOSIS — Z86711 Personal history of pulmonary embolism: Secondary | ICD-10-CM

## 2024-02-12 DIAGNOSIS — E66811 Obesity, class 1: Secondary | ICD-10-CM | POA: Insufficient documentation

## 2024-02-12 DIAGNOSIS — I25118 Atherosclerotic heart disease of native coronary artery with other forms of angina pectoris: Secondary | ICD-10-CM

## 2024-02-12 DIAGNOSIS — I10 Essential (primary) hypertension: Secondary | ICD-10-CM

## 2024-02-12 DIAGNOSIS — Z794 Long term (current) use of insulin: Secondary | ICD-10-CM

## 2024-02-12 DIAGNOSIS — E1140 Type 2 diabetes mellitus with diabetic neuropathy, unspecified: Secondary | ICD-10-CM | POA: Diagnosis not present

## 2024-02-12 DIAGNOSIS — E782 Mixed hyperlipidemia: Secondary | ICD-10-CM | POA: Diagnosis not present

## 2024-02-12 NOTE — Patient Instructions (Signed)

## 2024-02-12 NOTE — Progress Notes (Signed)
 Cardiology Office Note:    Date:  02/12/2024   ID:  Roberto Sosa, DOB [birth year], MRN [id number]  PCP:  Ofilia Lamar CROME, MD  Cardiologist:  Jennifer JONELLE Crape, MD   Referring MD: Ofilia Lamar CROME, MD    ASSESSMENT:    1. Mixed hyperlipidemia   2. Atherosclerosis of native coronary artery of native heart with stable angina pectoris   3. Primary hypertension   4. Type 2 diabetes mellitus with diabetic neuropathy, with long-term current use of insulin  (HCC)   5. Obesity (BMI 30.0-34.9)   6. History of pulmonary embolism    PLAN:    In order of problems listed above:  Coronary atherosclerosis: Secondary prevention stressed with the patient.  Importance of compliance with diet medication stressed and patient verbalized standing.  He was advised to ambulate the best of his ability.  I discussed with him further evaluation for progression of coronary artery disease and he is not keen on it at this time as his symptoms are stable. Essential hypertension: Blood pressure is stable and diet was emphasized. Mixed dyslipidemia: On lipid-lowering medications followed by primary care.  Goal LDL must be less than 60. Obesity: Weight reduction stressed and diet emphasized. Dyspnea on exertion: Stable and he is followed by primary care and his physicians at [facility]. Patient will be seen in follow-up appointment in 9 months or earlier if the patient has any concerns.    Medication Adjustments/Labs and Tests Ordered: Current medicines are reviewed at length with the patient today.  Concerns regarding medicines are outlined above.  Orders Placed This Encounter  Procedures   EKG 12-Lead   No orders of the defined types were placed in this encounter.    No chief complaint on file.    History of Present Illness:    Roberto Sosa is a 63 y.o. male.  Patient has past medical history of coronary atherosclerosis, essential hypertension, mixed dyslipidemia and diabetes mellitus and  obesity.  He denies any problems at this time and takes care of activities of daily living.  He has his usual shortness of breath on exertion.  At the time of my evaluation, the patient is alert awake oriented and in no distress.  He is on anticoagulation for history of pulmonary embolism.  Past Medical History:  Diagnosis Date   Atherosclerosis of native coronary artery of native heart with stable angina pectoris 06/09/2015   Bilateral lower extremity edema 01/25/2022   Formatting of this note might be different from the original. 01/25/2022 :   Carbuncle of abdominal wall 12/29/2019   Formatting of this note might be different from the original. 12/29/2019: supraumbilical   Closed fracture of one rib of right side with routine healing 02/06/2023   01/2023 : left 7th, slip and fall     Coronary artery disease    Diabetes mellitus without complication (HCC)    DVT (deep venous thrombosis) (HCC) 03/07/2016   Dyspnea 08/28/2017   2019: onset after PE   High cholesterol    History of deep venous thrombosis 03/07/2016   Overview:  2017: LLE, extensive, thrombectomy and 2 venous stents, no PE . Post-trauma. Xarelto    History of pulmonary embolism 07/27/2017   2019: occurred off NOAC   Hypertension 02/03/2022   Laceration of right lower leg 03/28/2022   02/2022     Left thigh pain 03/03/2016   Long term (current) use of anticoagulants 11/17/2018   Mixed hyperlipidemia 06/09/2015   Moderate persistent asthma without complication 03/20/2019  Nephrolithiasis 03/14/2016   Type 2 diabetes mellitus with diabetic neuropathy, with long-term current use of insulin  (HCC) 06/09/2015   Type 2 diabetes mellitus with microalbuminuria, with long-term current use of insulin  (HCC) 06/09/2015   2018: UACR=200   Vaccination refused by patient 04/23/2020   Formatting of this note might be different from the original. 04/23/2020: all but Tdap, discussed    Past Surgical History:  Procedure Laterality Date    CARDIAC CATHETERIZATION Left 03/09/2016   Procedure: Intravascular Ultrasound/IVUS;  Surgeon: Redell LITTIE Door, MD;  Location: [facility] INVASIVE CV LAB;  Service: Cardiovascular;  Laterality: Left;  IVC-LT COMMON ILIAC VEIN-LT EXR ILIAC VEIN-LT DEEP FEMORAL/POPLITEAL VEIN LYSIS AND THROMBECTOMY   CARDIAC CATHETERIZATION Left 03/10/2016   Procedure: Intravascular Ultrasound/IVUS;  Surgeon: Carlin FORBES Haddock, MD;  Location: [facility] INVASIVE CV LAB;  Service: Cardiovascular;  Laterality: Left;  Lower extremity venous.   CATARACT EXTRACTION Bilateral 02/2019   HUMERUS FRACTURE SURGERY Left    ORTHOPEDIC SURGERY Bilateral 06/2006   multiple fractures and breaks   PERIPHERAL VASCULAR CATHETERIZATION Left 03/09/2016   Procedure: Lower Extremity Venography;  Surgeon: Redell LITTIE Door, MD;  Location: [facility] INVASIVE CV LAB;  Service: Cardiovascular;  Laterality: Left;   PERIPHERAL VASCULAR CATHETERIZATION N/A 03/09/2016   Procedure: IVC Venography;  Surgeon: Redell LITTIE Door, MD;  Location: [facility] INVASIVE CV LAB;  Service: Cardiovascular;  Laterality: N/A;   PERIPHERAL VASCULAR CATHETERIZATION Left 03/10/2016   Procedure: Lower Extremity Venography;  Surgeon: Carlin FORBES Haddock, MD;  Location: [facility] INVASIVE CV LAB;  Service: Cardiovascular;  Laterality: Left;   PERIPHERAL VASCULAR CATHETERIZATION Left 03/10/2016   Procedure: Peripheral Vascular Intervention;  Surgeon: Carlin FORBES Haddock, MD;  Location: [facility] INVASIVE CV LAB;  Service: Cardiovascular;  Laterality: Left;  Left common and external iliac vein.     SHOULDER ARTHROSCOPY Right     Current Medications: Current Meds  Medication Sig   carvedilol  (COREG ) 25 MG tablet Take 1 tablet (25 mg total) by mouth 2 (two) times daily with a meal.   ELIQUIS 5 MG TABS tablet Take 5 mg by mouth 2 (two) times daily.    ezetimibe (ZETIA) 10 MG tablet Take 10 mg by mouth every morning.   FARXIGA 10 MG TABS tablet Take 10 mg by mouth every morning.   gabapentin  (NEURONTIN ) 600 MG tablet Take 600 mg  by mouth as needed (pain).   insulin  degludec (TRESIBA) 200 UNIT/ML FlexTouch Pen Inject 86 Units into the skin daily.   metFORMIN  (GLUCOPHAGE ) 1000 MG tablet Take 1,000 mg by mouth 2 (two) times daily with a meal.   nitroGLYCERIN  (NITROSTAT ) 0.4 MG SL tablet Place 1 tablet (0.4 mg total) under the tongue every 5 (five) minutes as needed for chest pain.   rosuvastatin  (CRESTOR ) 20 MG tablet Take 1 tablet (20 mg total) by mouth daily.   TRULICITY 4.5 MG/0.5ML SOPN Inject 4.5 mg into the skin once a week.     Allergies:   Patient has no known allergies.   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[employer]: [employer]    Comment: Media planner  Tobacco Use   Smoking status: Former    Current packs/day: 0.00    Average packs/day: 2.0 packs/day for 20.0 years (40.0 ttl pk-yrs)  Types: Cigarettes, Pipe, Cigars    Start date: 03/20/1970    Quit date: 03/20/1990    Years since quitting: 33.9   Smokeless tobacco: Current    Types: Chew  Substance and Sexual Activity   Alcohol use: Yes    Comment: 2 beers every 2 months   Drug use: No   Sexual activity: Not Currently  Other Topics Concern   Not on file  Social History Narrative   Not on file   Social Drivers of Health   Financial Resource Strain: Not on file  Food Insecurity: Low Risk  (10/01/2023)   Received from [facility]   Hunger Vital Sign    Within the past 12 months, you worried that your food would run out before you got money to buy more: Never true    Within the past 12 months, the food you bought just didn't last and you didn't have money to get more. : Never true  Transportation Needs: No Transportation Needs (10/01/2023)   Received from [facility]    In the past 12 months, has lack of  reliable transportation kept you from medical appointments, meetings, work or from getting things needed for daily living? : No  Physical Activity: Not on file  Stress: Not on file  Social Connections: Not on file     Family History: The patient's family history is not on file. He was adopted.  ROS:   Please see the history of present illness.    All other systems reviewed and are negative.  EKGs/Labs/Other Studies Reviewed:    The following studies were reviewed today: .SABRAEKG Interpretation Date/Time:  Tuesday February 12 2024 14:01:47 EST Ventricular Rate:  95 PR Interval:  212 QRS Duration:  104 QT Interval:  372 QTC Calculation: 467 R Axis:   161  Text Interpretation: Sinus rhythm with 1st degree A-V block Right axis deviation Incomplete right bundle branch block Possible Right ventricular hypertrophy Cannot rule out Anterior infarct , age undetermined Abnormal ECG No previous ECGs available Confirmed by Edwyna Backers [phone]) on 02/12/2024 2:16:22 PM     Recent Labs: No results found for requested labs within last 365 days.  Recent Lipid Panel    Component Value Date/Time   CHOL 89 (L) 03/28/2022 1309   TRIG 93 03/28/2022 1309   HDL 41 03/28/2022 1309   CHOLHDL 2.2 03/28/2022 1309   LDLCALC 30 03/28/2022 1309    Physical Exam:    VS:  BP 132/72   Pulse 95   Ht 6' 1 (1.854 m)   Wt 259 lb (117.5 kg)   SpO2 94%   BMI 34.17 kg/m     Wt Readings from Last 3 Encounters:  02/12/24 259 lb (117.5 kg)  02/23/23 271 lb 12.8 oz (123.3 kg)  03/22/22 244 lb 9.6 oz (110.9 kg)     GEN: Patient is in no acute distress HEENT: Normal NECK: No JVD; No carotid bruits LYMPHATICS: No lymphadenopathy CARDIAC: Hear sounds regular, 2/6 systolic murmur at the apex. RESPIRATORY:  Clear to auscultation without rales, wheezing or rhonchi  ABDOMEN: Soft, non-tender, non-distended MUSCULOSKELETAL:  No edema; No deformity  SKIN: Warm and dry NEUROLOGIC:  Alert and oriented x  3 PSYCHIATRIC:  Normal affect   Signed, Backers JONELLE Edwyna, MD  02/12/2024 2:24 PM    [facility]

## 2024-03-01 ENCOUNTER — Other Ambulatory Visit: Payer: Self-pay | Admitting: Cardiology

## 2024-03-01 DIAGNOSIS — E782 Mixed hyperlipidemia: Secondary | ICD-10-CM

## 2024-03-01 DIAGNOSIS — I2510 Atherosclerotic heart disease of native coronary artery without angina pectoris: Secondary | ICD-10-CM
